# Patient Record
Sex: Female | Born: 1985 | Race: White | Hispanic: Yes | Smoking: Never smoker
Health system: Southern US, Community
[De-identification: ages and names within clinical notes are randomized; demographics above are authoritative.]

## PROBLEM LIST (undated history)

## (undated) ENCOUNTER — Inpatient Hospital Stay (HOSPITAL_COMMUNITY): Payer: Self-pay

## (undated) DIAGNOSIS — Z789 Other specified health status: Secondary | ICD-10-CM

## (undated) HISTORY — DX: Other specified health status: Z78.9

---
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
## 2005-01-22 ENCOUNTER — Ambulatory Visit: Payer: Self-pay | Admitting: Family Medicine

## 2005-02-12 ENCOUNTER — Ambulatory Visit (HOSPITAL_COMMUNITY): Admission: RE | Admit: 2005-02-12 | Discharge: 2005-02-12 | Payer: Self-pay | Admitting: Family Medicine

## 2005-02-22 ENCOUNTER — Ambulatory Visit: Payer: Self-pay | Admitting: Family Medicine

## 2005-03-01 ENCOUNTER — Ambulatory Visit: Payer: Self-pay | Admitting: Sports Medicine

## 2005-03-07 ENCOUNTER — Ambulatory Visit: Payer: Self-pay | Admitting: Family Medicine

## 2005-03-26 ENCOUNTER — Ambulatory Visit: Payer: Self-pay | Admitting: Family Medicine

## 2005-03-27 ENCOUNTER — Ambulatory Visit: Payer: Self-pay | Admitting: Family Medicine

## 2005-03-27 ENCOUNTER — Ambulatory Visit (HOSPITAL_COMMUNITY): Admission: RE | Admit: 2005-03-27 | Discharge: 2005-03-27 | Payer: Self-pay | Admitting: Family Medicine

## 2005-04-25 ENCOUNTER — Ambulatory Visit: Payer: Self-pay | Admitting: Family Medicine

## 2005-05-29 ENCOUNTER — Ambulatory Visit: Payer: Self-pay | Admitting: Sports Medicine

## 2005-06-14 ENCOUNTER — Ambulatory Visit: Payer: Self-pay | Admitting: Family Medicine

## 2005-06-17 ENCOUNTER — Inpatient Hospital Stay (HOSPITAL_COMMUNITY): Admission: RE | Admit: 2005-06-17 | Discharge: 2005-06-17 | Payer: Self-pay | Admitting: Gynecology

## 2005-06-21 ENCOUNTER — Inpatient Hospital Stay (HOSPITAL_COMMUNITY): Admission: AD | Admit: 2005-06-21 | Discharge: 2005-06-21 | Payer: Self-pay | Admitting: Obstetrics & Gynecology

## 2005-06-25 ENCOUNTER — Ambulatory Visit: Payer: Self-pay | Admitting: Family Medicine

## 2005-06-25 ENCOUNTER — Inpatient Hospital Stay (HOSPITAL_COMMUNITY): Admission: RE | Admit: 2005-06-25 | Discharge: 2005-06-28 | Payer: Self-pay | Admitting: Family Medicine

## 2005-07-29 ENCOUNTER — Ambulatory Visit: Payer: Self-pay | Admitting: Family Medicine

## 2006-05-19 ENCOUNTER — Encounter (INDEPENDENT_AMBULATORY_CARE_PROVIDER_SITE_OTHER): Payer: Self-pay | Admitting: Family Medicine

## 2006-05-19 ENCOUNTER — Ambulatory Visit: Payer: Self-pay | Admitting: Family Medicine

## 2006-05-19 LAB — CONVERTED CEMR LAB
Basophils Absolute: 0 10*3/uL (ref 0.0–0.1)
Chlamydia, DNA Probe: NEGATIVE
Eosinophils Relative: 3 % (ref 0–5)
GC Probe Amp, Genital: NEGATIVE
HCT: 38.4 % (ref 36.0–46.0)
Hepatitis B Surface Ag: NEGATIVE
Lymphocytes Relative: 17 % (ref 12–46)
Lymphs Abs: 1.1 10*3/uL (ref 0.7–3.3)
Neutro Abs: 4.4 10*3/uL (ref 1.7–7.7)
Platelets: 317 10*3/uL (ref 150–400)
RDW: 16.2 % — ABNORMAL HIGH (ref 11.5–14.0)
Rh Type: POSITIVE
Rubella: >500 intl units/mL — ABNORMAL HIGH
WBC: 6.5 10*3/uL (ref 4.0–10.5)

## 2006-05-22 ENCOUNTER — Encounter (INDEPENDENT_AMBULATORY_CARE_PROVIDER_SITE_OTHER): Payer: Self-pay | Admitting: Family Medicine

## 2006-05-22 ENCOUNTER — Ambulatory Visit (HOSPITAL_COMMUNITY): Admission: RE | Admit: 2006-05-22 | Discharge: 2006-05-22 | Payer: Self-pay | Admitting: Family Medicine

## 2006-06-06 ENCOUNTER — Ambulatory Visit (HOSPITAL_COMMUNITY): Admission: RE | Admit: 2006-06-06 | Discharge: 2006-06-06 | Payer: Self-pay | Admitting: Family Medicine

## 2006-06-30 ENCOUNTER — Ambulatory Visit: Payer: Self-pay | Admitting: Family Medicine

## 2006-07-31 ENCOUNTER — Ambulatory Visit: Payer: Self-pay | Admitting: Family Medicine

## 2006-08-04 ENCOUNTER — Ambulatory Visit (HOSPITAL_COMMUNITY): Admission: RE | Admit: 2006-08-04 | Discharge: 2006-08-04 | Payer: Self-pay | Admitting: Family Medicine

## 2006-08-05 ENCOUNTER — Ambulatory Visit: Payer: Self-pay | Admitting: Family Medicine

## 2006-08-05 ENCOUNTER — Encounter (INDEPENDENT_AMBULATORY_CARE_PROVIDER_SITE_OTHER): Payer: Self-pay | Admitting: Family Medicine

## 2006-08-19 ENCOUNTER — Telehealth (INDEPENDENT_AMBULATORY_CARE_PROVIDER_SITE_OTHER): Payer: Self-pay | Admitting: *Deleted

## 2006-08-22 ENCOUNTER — Ambulatory Visit: Payer: Self-pay | Admitting: Family Medicine

## 2006-09-02 ENCOUNTER — Ambulatory Visit: Payer: Self-pay | Admitting: Family Medicine

## 2006-09-18 ENCOUNTER — Ambulatory Visit: Payer: Self-pay | Admitting: Sports Medicine

## 2006-09-29 ENCOUNTER — Ambulatory Visit: Payer: Self-pay | Admitting: Sports Medicine

## 2006-10-13 ENCOUNTER — Encounter (INDEPENDENT_AMBULATORY_CARE_PROVIDER_SITE_OTHER): Payer: Self-pay | Admitting: Family Medicine

## 2006-10-13 ENCOUNTER — Ambulatory Visit: Payer: Self-pay | Admitting: Family Medicine

## 2006-10-14 ENCOUNTER — Ambulatory Visit (HOSPITAL_COMMUNITY): Admission: RE | Admit: 2006-10-14 | Discharge: 2006-10-14 | Payer: Self-pay | Admitting: Family Medicine

## 2006-10-14 ENCOUNTER — Telehealth (INDEPENDENT_AMBULATORY_CARE_PROVIDER_SITE_OTHER): Payer: Self-pay | Admitting: *Deleted

## 2006-10-15 ENCOUNTER — Encounter (INDEPENDENT_AMBULATORY_CARE_PROVIDER_SITE_OTHER): Payer: Self-pay | Admitting: Family Medicine

## 2006-10-23 ENCOUNTER — Ambulatory Visit: Payer: Self-pay | Admitting: Family Medicine

## 2006-10-27 ENCOUNTER — Ambulatory Visit: Payer: Self-pay | Admitting: Family Medicine

## 2006-11-02 ENCOUNTER — Ambulatory Visit: Payer: Self-pay | Admitting: Physician Assistant

## 2006-11-02 ENCOUNTER — Inpatient Hospital Stay (HOSPITAL_COMMUNITY): Admission: AD | Admit: 2006-11-02 | Discharge: 2006-11-02 | Payer: Self-pay | Admitting: Obstetrics and Gynecology

## 2006-11-04 ENCOUNTER — Ambulatory Visit: Payer: Self-pay | Admitting: Family Medicine

## 2006-11-05 ENCOUNTER — Encounter (INDEPENDENT_AMBULATORY_CARE_PROVIDER_SITE_OTHER): Payer: Self-pay | Admitting: Family Medicine

## 2006-11-05 ENCOUNTER — Telehealth (INDEPENDENT_AMBULATORY_CARE_PROVIDER_SITE_OTHER): Payer: Self-pay | Admitting: *Deleted

## 2006-11-07 ENCOUNTER — Ambulatory Visit: Payer: Self-pay | Admitting: Obstetrics & Gynecology

## 2006-11-07 ENCOUNTER — Inpatient Hospital Stay (HOSPITAL_COMMUNITY): Admission: AD | Admit: 2006-11-07 | Discharge: 2006-11-08 | Payer: Self-pay | Admitting: Obstetrics & Gynecology

## 2006-11-11 ENCOUNTER — Telehealth (INDEPENDENT_AMBULATORY_CARE_PROVIDER_SITE_OTHER): Payer: Self-pay | Admitting: *Deleted

## 2006-11-19 ENCOUNTER — Telehealth (INDEPENDENT_AMBULATORY_CARE_PROVIDER_SITE_OTHER): Payer: Self-pay | Admitting: Family Medicine

## 2006-12-17 ENCOUNTER — Ambulatory Visit: Payer: Self-pay | Admitting: Family Medicine

## 2010-02-21 ENCOUNTER — Encounter: Payer: Self-pay | Admitting: *Deleted

## 2010-10-24 LAB — URINALYSIS, ROUTINE W REFLEX MICROSCOPIC
Glucose, UA: NEGATIVE
Hgb urine dipstick: NEGATIVE
Specific Gravity, Urine: 1.015
pH: 7

## 2010-10-24 LAB — URINE MICROSCOPIC-ADD ON

## 2010-10-24 LAB — CBC
Hemoglobin: 12.7
MCHC: 34.1
Platelets: 230
RDW: 16 — ABNORMAL HIGH

## 2011-12-02 ENCOUNTER — Emergency Department (HOSPITAL_COMMUNITY): Payer: No Typology Code available for payment source

## 2011-12-02 ENCOUNTER — Emergency Department (HOSPITAL_COMMUNITY)
Admission: EM | Admit: 2011-12-02 | Discharge: 2011-12-02 | Disposition: A | Discharge status: Home / Self Care | Payer: No Typology Code available for payment source | Attending: Emergency Medicine | Admitting: Emergency Medicine

## 2011-12-02 ENCOUNTER — Encounter (HOSPITAL_COMMUNITY): Payer: Self-pay | Admitting: *Deleted

## 2011-12-02 DIAGNOSIS — Z331 Pregnant state, incidental: Secondary | ICD-10-CM | POA: Insufficient documentation

## 2011-12-02 DIAGNOSIS — Z3490 Encounter for supervision of normal pregnancy, unspecified, unspecified trimester: Secondary | ICD-10-CM

## 2011-12-02 DIAGNOSIS — R51 Headache: Secondary | ICD-10-CM | POA: Insufficient documentation

## 2011-12-02 DIAGNOSIS — S139XXA Sprain of joints and ligaments of unspecified parts of neck, initial encounter: Secondary | ICD-10-CM | POA: Insufficient documentation

## 2011-12-02 DIAGNOSIS — R110 Nausea: Secondary | ICD-10-CM | POA: Insufficient documentation

## 2011-12-02 DIAGNOSIS — H539 Unspecified visual disturbance: Secondary | ICD-10-CM | POA: Insufficient documentation

## 2011-12-02 DIAGNOSIS — Y929 Unspecified place or not applicable: Secondary | ICD-10-CM | POA: Insufficient documentation

## 2011-12-02 DIAGNOSIS — R42 Dizziness and giddiness: Secondary | ICD-10-CM | POA: Insufficient documentation

## 2011-12-02 DIAGNOSIS — S161XXA Strain of muscle, fascia and tendon at neck level, initial encounter: Secondary | ICD-10-CM

## 2011-12-02 DIAGNOSIS — R55 Syncope and collapse: Secondary | ICD-10-CM

## 2011-12-02 DIAGNOSIS — Y939 Activity, unspecified: Secondary | ICD-10-CM | POA: Insufficient documentation

## 2011-12-02 LAB — COMPREHENSIVE METABOLIC PANEL
ALT: 14 U/L (ref 0–35)
AST: 20 U/L (ref 0–37)
CO2: 24 mEq/L (ref 19–32)
Calcium: 9.5 mg/dL (ref 8.4–10.5)
Chloride: 102 mEq/L (ref 96–112)
Creatinine, Ser: 0.61 mg/dL (ref 0.50–1.10)
GFR calc Af Amer: >90 mL/min (ref >90)
GFR calc non Af Amer: >90 mL/min (ref >90)
Glucose, Bld: 92 mg/dL (ref 70–99)
Sodium: 138 mEq/L (ref 135–145)
Total Bilirubin: 0.4 mg/dL (ref 0.3–1.2)

## 2011-12-02 LAB — URINALYSIS, ROUTINE W REFLEX MICROSCOPIC
Bilirubin Urine: NEGATIVE
Nitrite: NEGATIVE
Protein, ur: NEGATIVE mg/dL
Specific Gravity, Urine: 1.016 (ref 1.005–1.030)
Urobilinogen, UA: 0.2 mg/dL (ref 0.0–1.0)

## 2011-12-02 LAB — URINE MICROSCOPIC-ADD ON

## 2011-12-02 LAB — CBC WITH DIFFERENTIAL/PLATELET
Eosinophils Relative: 0 % (ref 0–5)
HCT: 38.7 % (ref 36.0–46.0)
Lymphocytes Relative: 17 % (ref 12–46)
Lymphs Abs: 2.2 10*3/uL (ref 0.7–4.0)
MCV: 88.4 fL (ref 78.0–100.0)
Monocytes Absolute: 0.9 10*3/uL (ref 0.1–1.0)
Neutro Abs: 9.3 10*3/uL — ABNORMAL HIGH (ref 1.7–7.7)
RBC: 4.38 MIL/uL (ref 3.87–5.11)
WBC: 12.4 10*3/uL — ABNORMAL HIGH (ref 4.0–10.5)

## 2011-12-02 LAB — HCG, QUANTITATIVE, PREGNANCY: hCG, Beta Chain, Quant, S: 130 m[IU]/mL — ABNORMAL HIGH (ref <5)

## 2011-12-02 MED ORDER — ACETAMINOPHEN 500 MG PO TABS: 500.0000 mg | ORAL_TABLET | Freq: Four times a day (QID) | ORAL | Status: DC | PRN

## 2011-12-02 MED ORDER — ONDANSETRON HCL 4 MG PO TABS: 4.0000 mg | ORAL_TABLET | Freq: Four times a day (QID) | ORAL | Status: DC

## 2011-12-02 MED ORDER — ACETAMINOPHEN 325 MG PO TABS
650.0000 mg | ORAL_TABLET | Freq: Once | ORAL | Status: AC
  Administered 2011-12-02: 650 mg via ORAL
  Filled 2011-12-02: qty 2
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
## 2011-12-02 NOTE — ED Notes (Signed)
Urine specimen obtained.

## 2011-12-04 LAB — URINE CULTURE

## 2011-12-06 ENCOUNTER — Inpatient Hospital Stay (HOSPITAL_COMMUNITY)
Admission: AD | Admit: 2011-12-06 | Discharge: 2011-12-06 | Disposition: A | Discharge status: Home / Self Care | Payer: Self-pay | Source: Ambulatory Visit | Attending: Obstetrics & Gynecology | Admitting: Obstetrics & Gynecology

## 2011-12-06 ENCOUNTER — Encounter (HOSPITAL_COMMUNITY): Payer: Self-pay

## 2011-12-06 DIAGNOSIS — O99891 Other specified diseases and conditions complicating pregnancy: Secondary | ICD-10-CM | POA: Insufficient documentation

## 2011-12-06 DIAGNOSIS — Z3400 Encounter for supervision of normal first pregnancy, unspecified trimester: Secondary | ICD-10-CM

## 2011-12-06 MED ORDER — CONCEPT OB 130-92.4-1 MG PO CAPS: 1.0000 | ORAL_CAPSULE | ORAL | Status: DC
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
## 2011-12-06 NOTE — MAU Provider Note (Signed)
Attestation of Attending Supervision of Advanced Practitioner (CNM/NP): Evaluation and management procedures were performed by the Advanced Practitioner under my supervision and collaboration.  I have reviewed the Advanced Practitioner's note and chart, and I agree with the management and plan.  Deasia Chiu, MD, FACOG Attending Obstetrician & Gynecologist Faculty Practice, [facility]  

## 2012-01-04 ENCOUNTER — Encounter (HOSPITAL_COMMUNITY): Payer: Self-pay | Admitting: Emergency Medicine

## 2012-01-04 ENCOUNTER — Emergency Department (INDEPENDENT_AMBULATORY_CARE_PROVIDER_SITE_OTHER)
Admission: EM | Admit: 2012-01-04 | Discharge: 2012-01-04 | Disposition: A | Discharge status: Home / Self Care | Payer: Self-pay | Source: Home / Self Care

## 2012-01-04 DIAGNOSIS — R1110 Vomiting, unspecified: Secondary | ICD-10-CM

## 2012-01-04 DIAGNOSIS — K219 Gastro-esophageal reflux disease without esophagitis: Secondary | ICD-10-CM

## 2012-01-04 LAB — POCT URINALYSIS DIP (DEVICE)
Ketones, ur: >=160 mg/dL — AB
Protein, ur: NEGATIVE mg/dL
Specific Gravity, Urine: 1.02 (ref 1.005–1.030)
Urobilinogen, UA: 1 mg/dL (ref 0.0–1.0)
pH: 6 (ref 5.0–8.0)

## 2012-01-04 MED ORDER — FAMOTIDINE 20 MG PO TABS: 20.0000 mg | ORAL_TABLET | Freq: Two times a day (BID) | ORAL | Status: DC

## 2012-01-04 MED ORDER — PROMETHAZINE HCL 12.5 MG PO TABS: 12.5000 mg | ORAL_TABLET | Freq: Four times a day (QID) | ORAL | Status: DC | PRN

## 2012-01-04 NOTE — ED Provider Notes (Signed)
Jacqueline Graves is a 26 y.o. female who presents to urgent care today for nausea vomiting and acid reflux.  Present for about one month. Patient is about 2 months into her first pregnancy.  She has not been able to eat much and does not feel very well.  She was initially prescribed Zofran and her [facility] but wasn't able to fill it as it was too expensive.  She denies any fevers chills nausea vomiting or abdominal pain.  She has acid reflux.  Most food makes her nauseated thinking about it.  She is waiting for adopt a mom placement.  No vaginal bleeding discharge fevers chills or headaches.    PMH reviewed. Pregnancy History  Substance Use Topics  . Smoking status: Never Smoker   . Smokeless tobacco: Not on file  . Alcohol Use: No   ROS as above otherwise neg   Exam:  BP 111/74  Pulse 100  Temp 99.2 F (37.3 C) (Oral)  Resp 18  SpO2 100%  LMP 11/03/2011 Gen: Well NAD HEENT: EOMI,  MMM Lungs: CTABL Nl WOB Heart: RRR no MRG Abd: NABS, NT, ND.  Fundus above the pelvis palpable Exts: Non edematous BL  LE, warm and well perfused.    Assessment and plan:  26 year old G1 P0 at about 2 months with nausea vomiting and likely acid reflux.  Plan to treat with famotidine twice daily for reflux. Additionally we'll treat with Phenergan as needed for nausea and vomiting and she cannot afford Zofran.  Patient will followup back here or at the MAU if not improved in unable to get in with a primary care Dr.     Rodolph Bong, MD 01/04/12 [phone]

## 2012-01-04 NOTE — ED Notes (Signed)
Pt is here for nauseas and vomiting x2 months... She's 2 months preg.... Denies: fevers, diarrhea... She is alert w/no signs of acute distress.

## 2012-01-05 NOTE — ED Provider Notes (Signed)
Medical screening examination/treatment/procedure(s) were performed by a resident physician and as supervising physician I was immediately available for consultation/collaboration.  Leslee Home, M.D.   Reuben Likes, MD 01/05/12 1045

## 2012-01-11 ENCOUNTER — Other Ambulatory Visit: Payer: Self-pay | Admitting: Family Medicine

## 2012-01-15 NOTE — L&D Delivery Note (Signed)
Delivery Summary for Jacqueline Graves  Labor Events:   Preterm labor:   Rupture date:   Rupture time:   Rupture type: Artificial  Fluid Color: Clear  Induction:   Augmentation:   Complications:   Cervical ripening:          Delivery:   Episiotomy:   Lacerations:   Repair suture:   Repair # of packets:   Blood loss (ml): 350   Information for the patient's newborn:  Kadin, Bera [[id number]]    Delivery 08/06/2012 3:35 PM by  Vaginal, Spontaneous Delivery Sex:  female Gestational Age: [redacted]w[redacted]d Delivery Clinician:  Jolyn Lent Living?: Yes        APGARS  One minute Five minutes Ten minutes  Skin color: 1   1      Heart rate: 2   2      Grimace: 2   2      Muscle tone: 2   2      Breathing: 2   2      Totals: 9  9      Presentation/position: Vertex  Right Occiput Anterior Resuscitation: None  Cord information: 3 vessels   Disposition of cord blood: No    Blood gases sent? No Complications: None  Placenta: Delivered: 08/06/2012 3:40 PM  Spontaneous  Intact appearance Newborn Measurements: Weight:   Height:   Head circumference:   Chest circumference:   Other providers: Delivery Nurse Midwife Marlaine Hind Melissa Noon  Additional  information: Forceps:   Vacuum:   Breech:   Observed anomalies        svd of female infant over hemostatice first degree tear. Manual extraction of placenta. Ancef 1g following. Hemostatic. Sweep negative for membranes. Continue to monitor.

## 2012-02-04 ENCOUNTER — Inpatient Hospital Stay (HOSPITAL_COMMUNITY): Payer: Self-pay

## 2012-02-04 ENCOUNTER — Encounter (HOSPITAL_COMMUNITY): Payer: Self-pay

## 2012-02-04 ENCOUNTER — Inpatient Hospital Stay (HOSPITAL_COMMUNITY)
Admission: AD | Admit: 2012-02-04 | Discharge: 2012-02-04 | Disposition: A | Discharge status: Home / Self Care | Payer: Self-pay | Source: Ambulatory Visit | Attending: Obstetrics and Gynecology | Admitting: Obstetrics and Gynecology

## 2012-02-04 DIAGNOSIS — O210 Mild hyperemesis gravidarum: Secondary | ICD-10-CM | POA: Insufficient documentation

## 2012-02-04 DIAGNOSIS — R109 Unspecified abdominal pain: Secondary | ICD-10-CM | POA: Insufficient documentation

## 2012-02-04 DIAGNOSIS — M545 Low back pain, unspecified: Secondary | ICD-10-CM | POA: Insufficient documentation

## 2012-02-04 LAB — COMPREHENSIVE METABOLIC PANEL
AST: 18 U/L (ref 0–37)
BUN: 4 mg/dL — ABNORMAL LOW (ref 6–23)
CO2: 22 mEq/L (ref 19–32)
Calcium: 8.9 mg/dL (ref 8.4–10.5)
Chloride: 100 mEq/L (ref 96–112)
Creatinine, Ser: 0.52 mg/dL (ref 0.50–1.10)
GFR calc non Af Amer: >90 mL/min (ref >90)
Total Bilirubin: 0.4 mg/dL (ref 0.3–1.2)

## 2012-02-04 LAB — URINALYSIS, ROUTINE W REFLEX MICROSCOPIC
Glucose, UA: NEGATIVE mg/dL
Ketones, ur: 40 mg/dL — AB
Leukocytes, UA: NEGATIVE
Protein, ur: NEGATIVE mg/dL
pH: 6 (ref 5.0–8.0)

## 2012-02-04 LAB — CBC
HCT: 33.2 % — ABNORMAL LOW (ref 36.0–46.0)
Hemoglobin: 11.2 g/dL — ABNORMAL LOW (ref 12.0–15.0)
MCHC: 33.7 g/dL (ref 30.0–36.0)
MCV: 89.2 fL (ref 78.0–100.0)
WBC: 8.6 10*3/uL (ref 4.0–10.5)

## 2012-02-04 LAB — URINE MICROSCOPIC-ADD ON

## 2012-02-04 LAB — WET PREP, GENITAL
Clue Cells Wet Prep HPF POC: NONE SEEN
Trich, Wet Prep: NONE SEEN

## 2012-02-04 MED ORDER — DOCUSATE SODIUM 100 MG PO CAPS: 100.0000 mg | ORAL_CAPSULE | Freq: Two times a day (BID) | ORAL | Status: DC

## 2012-02-04 MED ORDER — DEXTROSE 5 % IN LACTATED RINGERS IV BOLUS
1000.0000 mL | Freq: Once | INTRAVENOUS | Status: AC
  Administered 2012-02-04: 1000 mL via INTRAVENOUS

## 2012-02-04 MED ORDER — ONDANSETRON 8 MG PO TBDP: 8.0000 mg | ORAL_TABLET | Freq: Three times a day (TID) | ORAL | Status: DC | PRN

## 2012-02-04 MED ORDER — ONDANSETRON 8 MG PO TBDP
8.0000 mg | ORAL_TABLET | Freq: Once | ORAL | Status: AC
  Administered 2012-02-04: 8 mg via ORAL
  Filled 2012-02-04: qty 1

## 2012-02-04 MED ORDER — PROMETHAZINE HCL 25 MG PO TABS: ORAL_TABLET | ORAL | Status: DC

## 2012-02-04 NOTE — MAU Note (Signed)
Patient states she started having low back pain and lower abdominal pain with blood tinged "webbed" vaginal discharge for 2 days. Has had nausea and vomiting every day.

## 2012-02-04 NOTE — MAU Provider Note (Signed)
History     CSN: [id number]  Arrival date and time: 02/04/12 1613   None     Chief Complaint  Patient presents with  . Abdominal Pain  . Back Pain  . Vaginal Discharge  . Emesis   HPI Pt is [redacted]w[redacted]d pregnant and presents with lower back pain and lower abdominal pain for 2 days.  Pt has had nausea and vomiting and has not taken anything for the nausea or the pain.  Pt last ate this morning at 10 am and had 1/2 slice of bread. Pt is waiting for Adopt a Mom.  Pt also noted blood tinged vaginal discharge.  Pt has had some constipation but had a normal bowel movement this morning with minimal improvement in pain. Pt denies UTI symptoms. Pt had 2 previous normal full term vaginal deliveries without any complications. No past medical history on file.  No past surgical history on file.  No family history on file.  History  Substance Use Topics  . Smoking status: Never Smoker   . Smokeless tobacco: Not on file  . Alcohol Use: No    Allergies: No Known Allergies  Prescriptions prior to admission  Medication Sig Dispense Refill  . acetaminophen (TYLENOL) 500 MG tablet Take 1 tablet (500 mg total) by mouth every 6 (six) hours as needed for pain.  30 tablet  0  . famotidine (PEPCID) 20 MG tablet Take 1 tablet (20 mg total) by mouth 2 (two) times daily.  60 tablet  3  . Prenat w/o A Vit-FeFum-FePo-FA (CONCEPT OB) 130-92.4-1 MG CAPS Take 1 tablet by mouth 1 day or 1 dose.  30 capsule  5  . promethazine (PHENERGAN) 12.5 MG tablet Take 1 tablet (12.5 mg total) by mouth every 6 (six) hours as needed for nausea.  30 tablet  0    Review of Systems  Constitutional: Negative for fever and chills.  Gastrointestinal: Positive for nausea, vomiting, abdominal pain and constipation. Negative for diarrhea.  Musculoskeletal: Positive for back pain.   Physical Exam   Blood pressure 108/65, pulse 79, temperature 98.6 F (37 C), temperature source Oral, resp. rate 16, height 5\' 3"  (1.6 m), weight 134  lb 12.8 oz (61.145 kg), last menstrual period 11/03/2011, SpO2 99.00%.  Physical Exam  Nursing note and vitals reviewed. Constitutional: She is oriented to person, place, and time. She appears well-developed and well-nourished. No distress.  HENT:  Head: Normocephalic.  Eyes: Pupils are equal, round, and reactive to light.  Neck: Normal range of motion. Neck supple.  Cardiovascular: Normal rate.   Respiratory: Effort normal.  GI: Soft. Bowel sounds are normal. She exhibits no distension. There is tenderness. There is guarding. There is no rebound.       +FHT with doppler; left lower abdominal tenderness- no rebound; no CVA tenderness  Genitourinary: No vaginal discharge found.       Small amount of white creamy vaginal discharge; cervix long, closed; nontender; uterus gravid 13 week size; adnexal without palpable enlargement or tenderness; no bleeding noted  Musculoskeletal: Normal range of motion.  Neurological: She is alert and oriented to person, place, and time.  Skin: Skin is warm and dry.  Psychiatric: She has a normal mood and affect.  interpreter utilized  MAU Course  Procedures Clinical Data: 27 year old pregnant female with pelvic pain and  bleeding. Estimated gestational age of [redacted] weeks 2 days by LMP.  OBSTETRIC <14 WK ULTRASOUND  Technique: Transabdominal ultrasound was performed for evaluation  of the gestation as well  as the maternal uterus and adnexal  regions.  Comparison: None  Intrauterine gestational sac: Visualized/normal in shape.  Yolk sac: Not visualized  Embryo: Visualized  Cardiac Activity: Visualized  Heart Rate: 153 bpm  CRL: 7.35 cm 13 w 4 d [place] EDC: 08/07/2012  Maternal uterus/Adnexae:  There is no evidence of subchorionic hemorrhage.  The right ovary is not visualized.  The left ovary is unremarkable.  There is no evidence of free fluid or adnexal mass.  IMPRESSION:  Single living intrauterine gestation with estimated gestational age  of [redacted]  weeks 4 days by this ultrasound.  No evidence of subchorionic hemorrhage.  Right ovary not visualized.  This exam is performed on an emergent basis and does not  comprehensively evaluate fetal size, dating, or anatomy, and a  follow-up complete OB US should be considered if further fetal  assessment is warranted. .  Original Report Authenticated By: Harmon Pier, M.D.             External Result Report   Pt tolerated juice and crackers and was hungry after Zofran 8mg  ODT and 1 liter D5LR Ready to go home Will try to arrange for pt to be seen in [facility] OB clinic, since pt desires to pursue OB care and has not been able to get doctor with Adopt [facility] Mom at this point Results for orders placed during the hospital encounter of 02/04/12 (from the past 24 hour(s))  URINALYSIS, ROUTINE W REFLEX MICROSCOPIC     Status: Abnormal   Collection Time   02/04/12  4:45 PM      Component Value Range   Color, Urine YELLOW  YELLOW   APPearance CLEAR  CLEAR   Specific Gravity, Urine 1.025  1.005 - 1.030   pH 6.0  5.0 - 8.0   Glucose, UA NEGATIVE  NEGATIVE mg/dL   Hgb urine dipstick TRACE (*) NEGATIVE   Bilirubin Urine NEGATIVE  NEGATIVE   Ketones, ur 40 (*) NEGATIVE mg/dL   Protein, ur NEGATIVE  NEGATIVE mg/dL   Urobilinogen, UA 0.2  0.0 - 1.0 mg/dL   Nitrite NEGATIVE  NEGATIVE   Leukocytes, UA NEGATIVE  NEGATIVE  URINE MICROSCOPIC-ADD ON     Status: Abnormal   Collection Time   02/04/12  4:45 PM      Component Value Range   Squamous Epithelial / LPF RARE  RARE   WBC, UA 3-6  <3 WBC/hpf   RBC / HPF 3-6  <3 RBC/hpf   Bacteria, UA MANY (*) RARE   Urine-Other MUCOUS PRESENT    CBC     Status: Abnormal   Collection Time   02/04/12  5:38 PM      Component Value Range   WBC 8.6  4.0 - 10.5 K/uL   RBC 3.72 (*) 3.87 - 5.11 MIL/uL   Hemoglobin 11.2 (*) 12.0 - 15.0 g/dL   HCT 16.1 (*) 09.6 - 04.5 %   MCV 89.2  78.0 - 100.0 fL   MCH 30.1  26.0 - 34.0 pg   MCHC 33.7  30.0 - 36.0 g/dL   RDW 40.9  81.1 -  91.4 %   Platelets 284  150 - 400 K/uL  COMPREHENSIVE METABOLIC PANEL     Status: Abnormal   Collection Time   02/04/12  5:38 PM      Component Value Range   Sodium 136  135 - 145 mEq/L   Potassium 3.4 (*) 3.5 - 5.1 mEq/L   Chloride 100  96 - 112 mEq/L  CO2 22  19 - 32 mEq/L   Glucose, Bld 81  70 - 99 mg/dL   BUN 4 (*) 6 - 23 mg/dL   Creatinine, Ser 1.61  0.50 - 1.10 mg/dL   Calcium 8.9  8.4 - 09.6 mg/dL   Total Protein 6.7  6.0 - 8.3 g/dL   Albumin 3.4 (*) 3.5 - 5.2 g/dL   AST 18  0 - 37 U/L   ALT 16  0 - 35 U/L   Alkaline Phosphatase 46  39 - 117 U/L   Total Bilirubin 0.4  0.3 - 1.2 mg/dL   GFR calc non Af Amer >90  >90 mL/min   GFR calc Af Amer >90  >90 mL/min  WET PREP, GENITAL     Status: Abnormal   Collection Time   02/04/12  5:40 PM      Component Value Range   Yeast Wet Prep HPF POC NONE SEEN  NONE SEEN   Trich, Wet Prep NONE SEEN  NONE SEEN   Clue Cells Wet Prep HPF POC NONE SEEN  NONE SEEN   WBC, Wet Prep HPF POC MANY (*) NONE SEEN    Assessment and Plan  Hyperemesis in Pregnancy  Ferman Basilio 02/04/2012, 4:44 PM

## 2012-02-05 LAB — GC/CHLAMYDIA PROBE AMP
CT Probe RNA: NEGATIVE
GC Probe RNA: NEGATIVE

## 2012-02-05 LAB — URINE CULTURE

## 2012-02-05 NOTE — MAU Provider Note (Signed)
Attestation of Attending Supervision of Advanced Practitioner (CNM/NP): Evaluation and management procedures were performed by the Advanced Practitioner under my supervision and collaboration.  I have reviewed the Advanced Practitioner's note and chart, and I agree with the management and plan.  Jareth Pardee 02/05/2012 8:05 AM

## 2012-03-02 ENCOUNTER — Other Ambulatory Visit: Payer: Self-pay | Admitting: Obstetrics and Gynecology

## 2012-03-02 ENCOUNTER — Encounter: Payer: Self-pay | Admitting: Advanced Practice Midwife

## 2012-03-02 ENCOUNTER — Ambulatory Visit (INDEPENDENT_AMBULATORY_CARE_PROVIDER_SITE_OTHER): Payer: Self-pay | Admitting: Advanced Practice Midwife

## 2012-03-02 VITALS — BP 115/69 | Ht 64.75 in | Wt 140.6 lb

## 2012-03-02 DIAGNOSIS — Z3480 Encounter for supervision of other normal pregnancy, unspecified trimester: Secondary | ICD-10-CM

## 2012-03-02 MED ORDER — RANITIDINE HCL 150 MG PO TABS: 150.0000 mg | ORAL_TABLET | Freq: Two times a day (BID) | ORAL | Status: DC

## 2012-03-02 MED ORDER — METOCLOPRAMIDE HCL 10 MG PO TABS: 10.0000 mg | ORAL_TABLET | Freq: Four times a day (QID) | ORAL | Status: DC

## 2012-03-02 NOTE — Patient Instructions (Addendum)
Embarazo - Segundo trimestre (Pregnancy - Second Trimester) El segundo trimestre del Psychiatrist (del 3 al ) es un perodo de evolucin rpida para usted y el beb. Hacia el final del sexto mes, el beb mide aproximadamente 23 cm y pesa 680 g. Comenzar a Pharmacologist del beb [place] 18 y las 20 [address] de [place]. Podr sentir las pataditas ("quickening en ingls"). Hay un rpido [employer]. Puede segregar un lquido claro Charity fundraiser) de las Gregory. Quizs sienta pequeas contracciones en el vientre (tero) Esto se conoce como falso trabajo de parto o contracciones de Braxton-Hicks. Es como una prctica del trabajo de parto que se produce cuando el beb est listo para salir. Generalmente los problemas de vmitos matinales ya se han superado hacia el final del Medical sales representative. Algunas mujeres desarrollan pequeas manchas oscuras (que se denominan cloasma, mscara del embarazo) en la cara que normalmente se van luego del nacimiento del beb. La exposicin al sol empeora las manchas. Puede desarrollarse acn en algunas mujeres embarazadas, y puede desaparecer en aquellas que ya tienen acn. EXAMENES PRENATALES  Durante los Manpower Inc, deber seguir realizando pruebas de [place], segn avance el [place]. Estas pruebas se realizan para controlar su salud y la del beb. Tambin se realizan anlisis de sangre para [employer] niveles de [place]. La anemia (bajo nivel de hemoglobina) es frecuente durante el embarazo. Para prevenirla, se administran hierro y vitaminas. Tambin se le realizarn exmenes para saber si tiene diabetes entre las 24 y las 28 semanas del [place]. Podrn repetirle algunas de las [employer]ov[employer] Enterprises hicieron previamente.  En cada visita le medirn el tamao del tero. Esto se realiza para asegurarse de que el beb est creciendo correctamente de acuerdo al estado del [place].  Tambin en cada visita prenatal controlarn su presin arterial. Esto se realiza  para asegurarse de que no tenga toxemia.  Se controlar su orina para asegurarse de que no tenga infecciones, diabetes o protena en la orina.  Se controlar su peso regularmente para asegurarse que el aumento ocurre al ritmo indicado. Esto se hace para asegurarse que usted y el beb tienen una evolucin normal.  En algunas ocasiones se realiza una prueba de ultrasonido para confirmar el correcto desarrollo y evolucin del beb. Esta prueba se realiza con ondas sonoras inofensivas para el beb, de modo que el profesional pueda calcular ms precisamente la fecha del Darwin. Algunas veces se realizan pruebas especializadas del lquido amnitico que rodea al beb. Esta prueba se denomina amniocentesis. El lquido amnitico se obtiene introduciendo una aguja en el vientre (abdomen). Se realiza para Conservator, museum/gallery en los que existe alguna preocupacin acerca de algn problema gentico que pueda sufrir el beb. En ocasiones se lleva a cabo cerca del final del embarazo, si es necesario inducir al Apple Computer. En este caso se realiza para asegurarse que los pulmones del beb estn lo suficientemente maduros como para que pueda vivir fuera del tero. CAMBIOS QUE OCURREN EN EL SEGUNDO TRIMESTRE DEL EMBARAZO Su organismo atravesar numerosos cambios durante el [employer] Lots. Estos pueden variar de Neomia Dear persona a otra. [place] con el profesional que la asiste acerca los cambios que usted note y que la preocupen.  Durante el segundo trimestre probablemente sienta un aumento del apetito. Es normal tener "antojos" de Development worker, community. Esto vara de Neomia Dear persona a otra y de un embarazo a Therapist, art.  El abdomen inferior comenzar a abultarse.  Podr tener la necesidad de Geographical information systems officer con ms frecuencia debido a que  el tero y el beb presionan sobre la vejiga. Tambin es frecuente contraer ms infecciones urinarias durante el embarazo (dolor al ConocoPhillips). Puede evitarlas bebiendo gran cantidad de lquidos y vaciando  la vejiga antes y despus de Sales promotion account executive.  Podrn aparecer las primeras estras en las caderas, abdomen y [place]. Estos son cambios normales del cuerpo durante el [place]. No existen medicamentos ni ejercicios que puedan prevenir [employer].  Es posible que comience a desarrollar venas inflamadas y abultadas (varices) en las piernas. El uso de medias de descanso, Optometrist sus pies durante 15 minutos, 3 a 4 veces al da y Film/video editor la sal en su dieta ayuda a Journalist, newspaper.  Podr sentir Engineering geologist gstrica a medida que el tero crece y Doctor, general practice. Puede tomar anticidos, con la autorizacin de su mdico, para Financial planner. Tambin es til ingerir pequeas comidas 4 a 5 veces al Futures trader.  La constipacin puede tratarse con un laxante o agregando fibra a su dieta. Beber grandes cantidades de lquidos, comer vegetales, frutas y granos integrales es de [place].  Tambin es beneficioso practicar actividad fsica. Si ha sido una persona Engineer, mining, podr continuar con la Harley[employer]son de las actividades durante el mismo. Si ha sido [employer], puede ser beneficioso que comience con un programa de ejercicios, Museum/gallery exhibitions officer.  Puede desarrollar hemorroides (vrices en el recto) hacia el final del segundo trimestre. Tomar baos de asiento tibios y Chemical engineer cremas recomendadas por el profesional que lo asiste sern de ayuda para los problemas de hemorroides.  Tambin podr Financial risk analyst de espalda durante este momento de su embarazo. Evite levantar objetos pesados, utilice zapatos de taco bajo y [place] buena postura para ayudar a reducir los problemas de [place].  Algunas mujeres embarazadas desarrollan hormigueo y adormecimiento de la mano y los dedos debido a la hinchazn y compresin de los ligamentos de la mueca (sndrome del tnel carpiano). Esto desaparece una vez que el beb nace.  Como sus pechos se agrandan, Pension scheme manager un sujetador  ms grande. Use un sostn de soporte, cmodo y de algodn. No utilice un sostn para amamantar hasta el ltimo mes de embarazo si va a amamantar al beb.  Podr observar una lnea oscura desde el ombligo hacia la zona pbica denominada linea nigra.  Podr observar que sus mejillas se ponen coloradas debido al aumento de flujo sanguneo en la cara.  Podr desarrollar "araitas" en la cara, cuello y pecho. Esto desaparece una vez que el beb nace. INSTRUCCIONES PARA EL CUIDADO DOMICILIARIO  Es extremadamente importante que evite el cigarrillo, hierbas medicinales, alcohol y las drogas no prescriptas durante el Psychiatrist. Estas sustancias qumicas afectan la formacin y el desarrollo del beb. Evite estas sustancias durante todo el embarazo para asegurar el nacimiento de un beb sano.  La mayor parte de los cuidados que se aconsejan son los mismos que los indicados para Financial risk analyst trimestre del Psychiatrist. Cumpla con las citas tal como se le indic. Siga las instrucciones del profesional que lo asiste con respecto al uso de los medicamentos, el ejercicio y Psychologist, forensic.  Durante el embarazo debe obtener nutrientes para usted y para su beb. Consuma alimentos balanceados a intervalos regulares. Elija alimentos como carne, pescado, [place] y otros productos lcteos descremados, vegetales, frutas, panes integrales y cereales. El Equities trader cul es el aumento de peso ideal.  Las relaciones sexuales fsicas pueden continuarse hasta cerca del fin del embarazo si no existen otros problemas. Estos  problemas pueden ser la prdida temprana (prematura) de lquido amnitico de las [place], sangrado vaginal, dolor abdominal u otros problemas mdicos o del Psychiatrist.  Realice Tesoro Corporation, si no tiene restricciones. Consulte con el profesional que la asiste si no sabe con certeza si determinados ejercicios son seguros. El mayor aumento de peso tiene Environmental consultant durante los ltimos 2 trimestres del  Psychiatrist. El ejercicio la ayudar a:  Engineering geologist.  Ponerla en forma para el parto.  Ayudarla a perder peso luego de haber dado a luz.  Use un buen sostn o como los que se usan para hacer deportes para Paramedic la sensibilidad de las [place]. Tambin puede serle til si lo [place] mientras duerme. Si pierde Product manager, podr Parker Hann[employer]in.  No utilice la baera con agua caliente, baos turcos y saunas durante el [address]  Utilice el cinturn de seguridad sin excepcin cuando conduzca. Este la proteger a usted y al beb en caso de accidente.  Evite comer carne cruda, queso crudo, y el contacto con los utensilios y desperdicios de los gatos. Estos elementos contienen grmenes que pueden causar defectos de nacimiento en el beb.  El segundo trimestre es un buen momento para visitar a su dentista y Software engineer si an no lo ha hecho. Es Primary school teacher los dientes limpios. Utilice un cepillo de dientes blando. Cepllese ms suavemente durante el embarazo.  Es ms fcil perder algo de orina durante el [place]. Apretar y Chief Operating Officer los msculos de la pelvis la ayudar con este problema. Practique detener la miccin cuando est en el bao. Estos son los mismos msculos que Development worker, international aid. Son [employer] mismos msculos que utiliza cuando trata de [employer] gases. Puede practicar apretando estos msculos 10 veces, y repetir esto 3 veces por da aproximadamente. Una vez que conozca qu msculos debe apretar, no realice estos ejercicios durante la miccin. Puede favorecerle una infeccin si la orina vuelve hacia atrs.  Pida ayuda si tiene necesidades econmicas, de asesoramiento o nutricionales durante el [place]. El profesional podr ayudarla con respecto a estas necesidades, o derivarla a otros especialistas.  La piel puede ponerse grasa. Si esto sucede, lvese la cara con un jabn [place], utilice un humectante no graso y [place] con base de aceite o  crema. CONSUMO DE MEDICAMENTOS Y DROGAS DURANTE EL EMBARAZO  Contine tomando las vitaminas apropiadas para esta etapa tal como se le indic. Las vitaminas deben contener un miligramo de cido flico y deben suplementarse con hierro. Guarde todas las vitaminas fuera del alcance de los nios. La ingestin de slo un par de vitaminas o tabletas que contengan hierro puede ocasionar la [employer] en un beb o en un nio pequeo.  Evite el uso de [place], inclusive los de venta libre y hierbas que no hayan sido prescriptos o indicados por el profesional que la asiste. Algunos medicamentos pueden causar problemas fsicos al beb. Utilice los medicamentos de venta libre o de prescripcin para Chief Technology Officer, Environmental health practitioner o la [place], segn se lo indique el profesional que lo asiste. No utilice aspirina.  El consumo de alcohol est relacionado con ciertos defectos de nacimiento. Esto incluye el sndrome de alcoholismo fetal. Debe evitar el consumo de alcohol en cualquiera de sus formas. El cigarrillo causa nacimientos prematuros y bebs de [place]. El uso de drogas recreativas est absolutamente prohibido. Son muy nocivas para el beb. Un beb que nace de [employer] Express, ser adicto al nacer. Ese beb tendr los mismos  sntomas de abstinencia que un adulto.  Infrmele al profesional si consume alguna droga.  No consuma drogas ilegales. Pueden causarle mucho dao al beb. SOLICITE ATENCIN MDICA SI: Tiene preguntas o preocupaciones durante su embarazo. Es mejor que llame para Science writer las dudas que esperar hasta su prxima visita prenatal. Thressa Sheller forma se sentir ms tranquila.  SOLICITE ATENCIN MDICA DE INMEDIATO SI:  La temperatura oral se eleva sin motivo por encima de 102 F (38.9 C) o segn le indique el profesional que lo asiste.  Tiene una prdida de lquido por la vagina (canal de parto). Si sospecha una ruptura de las [place], tmese la temperatura y llame al profesional para informarlo sobre  esto.  Observa unas pequeas manchas, una hemorragia vaginal o elimina cogulos. Notifique al profesional acerca de la cantidad y de cuntos apsitos est utilizando. Unas pequeas manchas de sangre son algo comn durante el Psychiatrist, especialmente despus de Sales promotion account executive.  Presenta un olor desagradable en la secrecin vaginal y observa un cambio en el color, de transparente a blanco.  Contina con las nuseas y no obtiene alivio de los remedios indicados. Vomita sangre o algo similar a la borra del caf.  [place] o sube ms de 900 g. en una semana, o segn lo indicado por el profesional que la asiste.  Observa que se le Southwest [employer], las manos, los pies o las piernas.  Ha estado expuesta a la rubola y no ha sufrido la enfermedad.  Ha estado expuesta a la quinta enfermedad o a la varicela.  Presenta dolor abdominal. Las molestias en el ligamento redondo son Neomia Dear causa no cancerosa (benigna) frecuente de dolor abdominal durante el embarazo. El profesional que la asiste deber evaluarla.  Presenta dolor de cabeza intenso que no se [place].  Presenta fiebre, diarrea, dolor al orinar o le falta la respiracin.  Presenta dificultad para ver, visin borrosa, o visin doble.  Sufre una cada, un accidente de trnsito o cualquier tipo de trauma.  Vive en un hogar en el que existe violencia fsica o mental. Document Released: 10/10/2004 Document Revised: 03/25/2011 [facility] Patient Information 2013 [place], [place]. Dolor de Merchandiser, retail  (Back Pain in Pregnancy)  El dolor de espalda es habitual durante el embarazo. Ocurre en aproximadamente la mitad de todos los [place]. Es importante para usted y su beb que permanezca activa durante el [place].Si siente que Chief Technology Officer de espalda es lo que no le permite mantenerse activa o dormir bien, Scientist, clinical (histocompatibility and immunogenetics) a su mdico. La causa del dolor de espalda puede deberse a varios factores relacionados con los cambios durante  el [place].Afortunadamente, excepto que haya tenido problemas de espalda antes del [place], es probable que el dolor mejore despus del [place]. El dolor lumbar por lo general ocurre entre el quinto y sptimo mes del Psychiatrist. Sin embargo, puede ocurrir [employer] primeros meses. Otros factores que aumentan el riesgo son:   Problemas previos en la espalda.  Lesiones en la espalda.  Tener gemelos o embarazos mltiples.  Tos persistente.  El estrs.  Movimientos repetitivos relacionados con Kathie Dike.  Enfermedad muscular o de la columna vertebral en la espalda.  Antecedentes familiares de problemas de espalda, rotura (hernia) de discos u osteoporosis.  Depresin, ansiedad y crisis de [place]. CAUSAS   En las embarazadas, el cuerpo produce una hormona llamada relaxina. Esta hormona hace que los ligamentos que conectan la zona lumbar y los huesos del pubis sean ms flexibles. Esta flexibilidad permite que el beb nazca  con ms facilidad. Cuando los ligamentos estn relajados, los msculos tienen que trabajar ms para apoyar la espalda. El dolor en la espalda puede deberse al cansancio muscular. El dolor tambin puede tener su causa en la irritacin de los tejidos de a espalda que se irritan ya que estn recibiendo menos apoyo.  A medida que el beb crece, ejerce presin United Stationers nervios y los vasos sanguneos de la pelvis. Esto causa dolor de espalda.  A medida que el beb crece y [address] durante el [place], el tero presiona los msculos del estmago hacia adelante y [place] su centro de gravedad. Esto hace que los msculos de la espalda deban trabajar ms para mantener una buena [place]. SNTOMAS  Dolor lumbar durante el embarazo Generalmente se produce en la zona o por arriba de la cintura en el centro de la espalda. Puede haber dolor y entumecimiento que se irradia hacia la pierna o el pie. Es similar al dolor de espalda baja experimentada por las mujeres no embarazadas. Por  lo general, aumenta al UnitedHealth de pie o sentada por largos perodos de [place], o con levantamientos repetitivos Tambin puede haber sensibilidad en los msculos en la zona superior de la espalda .  Dolor plvico posterior Environmental consultant en la parte posterior de la pelvis es ms frecuente que el dolor lumbar en el embarazo. Se trata de un dolor profundo que se siente a un lado en la cintura, o a travs del cxis (sacro), o en ambos lugares. Puede sentir dolor en uno o ambos lados Este dolor tambin puede sentirse en las nalgas y el dorso de los muslos Tambin puede haber dolor pbico y en la ingle. El dolor no se mejora rpidamente con el reposo, y [place] puede haber rigidez matutina. Muchas actividades pueden causarlo. Un buen estado fsico antes y [address] puede o no prevenir este problema. Las contracciones del parto suelen aparecer cada 1 a 2 minutos, tienen una duracin de aproximadamente 1 minuto, e implica una sensacin de empujar o presin en la pelvis. Sin embargo, si usted est a trmino con Firefighter, Chief Technology Officer constante en la zona lumbar puede indicar el comienzo de un parto prematuro, y usted debe ser consciente de ello.  DIAGNSTICO  No se deben tomar radiografas de la [employer] las primeras 12 a 14 semanas del embarazo y durante el resto del Psychiatrist, slo cuando sea absolutamente necesario. La resonancia magntica no emite radiacin y es un estudio seguro durante el Psychiatrist. Pero tambin se deben hacer solamente cuando sea absolutamente necesario.  INSTRUCCIONES PARA EL CUIDADO EN EL HOGAR   Realice actividad fsica segn las indicaciones del mdico. El ejercicio es la manera ms eficaz para prevenir o tratar Chief Technology Officer de espalda. Si tiene un problema en la espalda, es especialmente importante evitar los deportes que requieran de movimientos corporales rpidos. La natacin y las caminatas son las mejores [address].  No permanezca sentada o de pie en el  mismo lugar durante largos perodos.  No use tacos altos.  Sintese en la silla con una buena postura. Use una almohada en su espalda baja si es necesario. Asegrese de que su cabeza descansa sobre sus hombros y no est colgando hacia delante.  Trate de dormir de lado, de preferencia el lado izquierdo, con una o [employer] piernas. Si est dolorida despus de una noche de descanso, la cama puede ser [employer].Trate de colocar una tabla entre el colchn y el somier.  Prstele atencin a su cuerpo cuando se levante.Si siente dolor,pida ayuda o trate de doblar las rodillas ms para [employer] de las piernas en lugar de los msculos de la espalda. Pngase en cuclillas al levantar algo del suelo. No se doble.  Consuma una dieta saludable. Trate de aumentar de peso dentro de las recomendaciones de su mdico.  Utilice compresas de calor o fro de 3 a 4 veces al da durante 15 minutos para Primary school teacher.  Solo tome medicamentos que se pueden comprar sin receta o recetados para Chief Technology Officer, Dentist o fiebre, como le indica el mdico. Dolor de espaldas repentino (agudo).  Haga reposo en cama slo en caso de los episodios ms extremos y agudos de Engineer, mining. El reposo prolongado en cama de ms de 48 horas agravar su trastorno.  El hielo es muy efectivo en los problemas agudos.  Ponga el hielo en una bolsa plstica.  Colquese una toalla entre la piel y la bolsa de hielo.  Deje el hielo durante 10 a 20 minutos cada 2 horas o segn lo nesecite, mientras se encuentre despierta.  Las compresas de calor durante 30 minutos antes de las actividades tambin puede ayudar. Dolor crnico en la espalda. Consulte a su mdico si el dolor es continuo. El mdico podr ayudarla o derivarla para que realice los ejercicios y trabajos de fortalecimiento apropiados. Con un buen entrenamiento fsico, podr evitar la mayor parte de los [place]. En algunos casos, la causa es un problema ms  grave. Debe ser controlada inmediatamente si aparecen nuevos problemas. El mdico tambin podr recomendar:   Una faja de maternidad.  Un arns elstico.  Un cors para la espalda.  Un masajista o acupuntura. SOLICITE ATENCIN MDICA SI:   No puede Careers information officer de sus actividades diarias, an tomando los medicamentos para Psychologist, occupational.  Beverlee Nims ser derivada a un fisioterapeuta o quiroprxico.  Beverlee Nims intentar con acupuntura. SOLICITE ATENCIN MDICA DE INMEDIATO SI:   Siente entumecimiento, hormigueo, debilidad o problemas con el uso de los brazos o las piernas.  Siente un dolor de espalda muy intenso que no se alivia con medicamentos.  Tiene modificaciones repentinas en el control de la vejiga o el intestino.  Aumenta el dolor en otras partes del cuerpo.  Siente que le falta el aire, se marea o sufre un [place].  Tiene nuseas, vmitos o sudoracin.  Siente un dolor en la espalda similar al del [place].  Cuando aparece [employer]els, rompe la bolsa de aguas o tiene un sangrado vaginal.  El dolor o el adormecimiento se extienden hacia la pierna.  El dolor aparece despus de una cada.  Siente dolor de un solo lado. Podra tener clculos renales.  Observa sangre en la orina. Podra tener una infeccin en la vejiga o clculos renales.  Siente dolor y aparecen ronchas. Podra tener culebrilla. El dolor de espalda es bastante frecuente durante el embarazo pero no debe aceptarse slo como parte del [place]. Siempre debe tratarse lo ms rpidamente posible. Har que su embarazo sea lo ms placentero posible.  Document Released: 09/12/2010 Document Revised: 03/25/2011 [facility] Patient Information 2013 [place], [place].

## 2012-03-02 NOTE — Progress Notes (Signed)
Pulse: 86 Needs to see nutrition and social work

## 2012-03-02 NOTE — Progress Notes (Signed)
Nutrition note: 1st visit consult Pt has h/o wt loss during 1st trimester. Pt has lost 4.4# @ [redacted]w[redacted]d.  Pt reports she had severe N&V in the beginning but has resolved now. Pt reports having a little heartburn but medicine helps. Pt is taking a PNV. Pt is allergic to shrimp. Pt reports eating 2 meals & 2-3 snacks/d.  Pt reports she is getting very little physical activity. Pt received verbal & written education on general nutrition during pregnancy in Spanish via translator. Disc tips to increase wt gain & encouraged pt to include a protein source with all meals & snacks.  Disc wt gain goals of 25-35# or 1#/wk. Pt agrees to continue to take PNV & eat calorie dense snacks. Pt has WIC & plans to BF. F/u in 4-6 wks Blondell Reveal, MS, RD, LDN

## 2012-03-02 NOTE — Progress Notes (Signed)
   Subjective:    Jacqueline Graves is a [id number] [redacted]w[redacted]d being seen today for her first obstetrical visit.  Uncomplicated OB history. Patient does intend to breast feed. States she had difficulty with latch when nursing previous babies. Pregnancy history fully reviewed.  Patient reports backache, heartburn, nausea, no bleeding, no contractions and no cramping.  Filed Vitals:   03/02/12 1011  BP: 115/69  Weight: 140 lb 9.6 oz (63.776 kg)    HISTORY: OB History   Grav Para Term Preterm Abortions TAB SAB Ect Mult Living   4 2 2  1  1   2      # Outc Date GA Lbr Len/2nd Wgt Sex Del Anes PTL Lv   1 TRM 6/07 [redacted]w[redacted]d   F SVD   Yes   Comments: Born at [facility]   2 TRM 10/08 [redacted]w[redacted]d   F SVD   Yes   3 SAB  [redacted]w[redacted]d          4 CUR              Past Medical History  Diagnosis Date  . Medical history non-contributory    History reviewed. No pertinent past surgical history. History reviewed. No pertinent family history.   Exam    Uterus:     Pelvic Exam:    Perineum: No Hemorrhoids, Normal Perineum   Vulva: normal, Bartholin's, Urethra, Skene's normal   Vagina:  normal mucosa, normal discharge       Cervix: multiparous appearance and no lesions   Adnexa: no mass, fullness, tenderness   Bony Pelvis: proven to 9#  System:     Skin: normal coloration and turgor, no rashes    Neurologic: oriented, normal   Extremities: normal strength, tone, and muscle mass       Mouth/Teeth mucous membranes moist, pharynx normal without lesions       Cardiovascular: regular rate and rhythm   Respiratory:  appears well, vitals normal, no respiratory distress, acyanotic, normal RR   Abdomen: soft, non-tender; bowel sounds normal; no masses,  no organomegaly   Urinary: urethral meatus normal      Assessment:    Pregnancy: [id number] Patient Active Problem List  Diagnosis  . Supervision of normal intrauterine pregnancy in multigravida        Plan:     Initial labs drawn. Prenatal  vitamins. Reglan for n/v. Ranitidine for heartburn.  Rev'd comfort measures for back pain.  Problem list reviewed and updated. Genetic Screening discussed Quad Screen: declined.  Ultrasound discussed; fetal survey: ordered.  Follow up in 4 weeks. Precautions rev'd   Barb Shear 03/02/2012

## 2012-03-02 NOTE — Progress Notes (Signed)
U/S scheduled 03/16/12 at 315pm.

## 2012-03-03 ENCOUNTER — Encounter: Payer: Self-pay | Admitting: *Deleted

## 2012-03-03 LAB — OBSTETRIC PANEL
Hemoglobin: 10.3 g/dL — ABNORMAL LOW (ref 12.0–15.0)
Hepatitis B Surface Ag: NEGATIVE
Lymphocytes Relative: 18 % (ref 12–46)
Lymphs Abs: 1.4 10*3/uL (ref 0.7–4.0)
Monocytes Relative: 8 % (ref 3–12)
Neutro Abs: 5.6 10*3/uL (ref 1.7–7.7)
Neutrophils Relative %: 73 % (ref 43–77)
Platelets: 280 10*3/uL (ref 150–400)
RBC: 3.43 MIL/uL — ABNORMAL LOW (ref 3.87–5.11)
Rh Type: POSITIVE
Rubella: 14.1 Index — ABNORMAL HIGH (ref <0.90)
WBC: 7.6 10*3/uL (ref 4.0–10.5)

## 2012-03-03 LAB — CULTURE, OB URINE: Colony Count: 40000

## 2012-03-03 LAB — POCT URINALYSIS DIP (DEVICE)
Bilirubin Urine: NEGATIVE
Glucose, UA: NEGATIVE mg/dL
Hgb urine dipstick: NEGATIVE
Ketones, ur: NEGATIVE mg/dL
Specific Gravity, Urine: 1.025 (ref 1.005–1.030)
Urobilinogen, UA: 1 mg/dL (ref 0.0–1.0)

## 2012-03-16 ENCOUNTER — Ambulatory Visit (HOSPITAL_COMMUNITY)
Admission: RE | Admit: 2012-03-16 | Discharge: 2012-03-16 | Disposition: A | Discharge status: Home / Self Care | Payer: Self-pay | Source: Ambulatory Visit | Attending: Advanced Practice Midwife | Admitting: Advanced Practice Midwife

## 2012-03-16 DIAGNOSIS — Z363 Encounter for antenatal screening for malformations: Secondary | ICD-10-CM | POA: Insufficient documentation

## 2012-03-16 DIAGNOSIS — Z1389 Encounter for screening for other disorder: Secondary | ICD-10-CM | POA: Insufficient documentation

## 2012-03-16 DIAGNOSIS — O358XX0 Maternal care for other (suspected) fetal abnormality and damage, not applicable or unspecified: Secondary | ICD-10-CM | POA: Insufficient documentation

## 2012-03-16 DIAGNOSIS — Z3480 Encounter for supervision of other normal pregnancy, unspecified trimester: Secondary | ICD-10-CM

## 2012-04-01 ENCOUNTER — Encounter: Payer: Self-pay | Admitting: Advanced Practice Midwife

## 2012-04-01 ENCOUNTER — Ambulatory Visit (INDEPENDENT_AMBULATORY_CARE_PROVIDER_SITE_OTHER): Payer: Self-pay | Admitting: Advanced Practice Midwife

## 2012-04-01 ENCOUNTER — Other Ambulatory Visit: Payer: Self-pay | Admitting: Advanced Practice Midwife

## 2012-04-01 VITALS — BP 117/71 | Temp 98.8°F | Wt 147.0 lb

## 2012-04-01 DIAGNOSIS — Z3480 Encounter for supervision of other normal pregnancy, unspecified trimester: Secondary | ICD-10-CM

## 2012-04-01 LAB — POCT URINALYSIS DIP (DEVICE)
Bilirubin Urine: NEGATIVE
Glucose, UA: NEGATIVE mg/dL
Ketones, ur: NEGATIVE mg/dL
Leukocytes, UA: NEGATIVE
Nitrite: NEGATIVE

## 2012-04-01 NOTE — Patient Instructions (Signed)
Pregnancy - Second Trimester The second trimester of pregnancy (3 to 6 months) is a period of rapid growth for you and your baby. At the end of the sixth month, your baby is about 9 inches long and weighs 1 1/2 pounds. You will begin to feel the baby move between 18 and 20 weeks of the pregnancy. This is called quickening. Weight gain is faster. A clear fluid (colostrum) may leak out of your breasts. You may feel small contractions of the womb (uterus). This is known as false labor or Braxton-Hicks contractions. This is like a practice for labor when the baby is ready to be born. Usually, the problems with morning sickness have usually passed by the end of your first trimester. Some women develop small dark blotches (called cholasma, mask of pregnancy) on their face that usually goes away after the baby is born. Exposure to the sun makes the blotches worse. Acne may also develop in some pregnant women and pregnant women who have acne, may find that it goes away. PRENATAL EXAMS  Blood work may continue to be done during prenatal exams. These tests are done to check on your health and the probable health of your baby. Blood work is used to follow your blood levels (hemoglobin). Anemia (low hemoglobin) is common during pregnancy. Iron and vitamins are given to help prevent this. You will also be checked for diabetes between 24 and 28 weeks of the pregnancy. Some of the previous blood tests may be repeated.  The size of the uterus is measured during each visit. This is to make sure that the baby is continuing to grow properly according to the dates of the pregnancy.  Your blood pressure is checked every prenatal visit. This is to make sure you are not getting toxemia.  Your urine is checked to make sure you do not have an infection, diabetes or protein in the urine.  Your weight is checked often to make sure gains are happening at the suggested rate. This is to ensure that both you and your baby are growing  normally.  Sometimes, an ultrasound is performed to confirm the proper growth and development of the baby. This is a test which bounces harmless sound waves off the baby so your caregiver can more accurately determine due dates. Sometimes, a specialized test is done on the amniotic fluid surrounding the baby. This test is called an amniocentesis. The amniotic fluid is obtained by sticking a needle into the belly (abdomen). This is done to check the chromosomes in instances where there is a concern about possible genetic problems with the baby. It is also sometimes done near the end of pregnancy if an early delivery is required. In this case, it is done to help make sure the baby's lungs are mature enough for the baby to live outside of the womb. CHANGES OCCURING IN THE SECOND TRIMESTER OF PREGNANCY Your body goes through many changes during pregnancy. They vary from person to person. Talk to your caregiver about changes you notice that you are concerned about.  During the second trimester, you will likely have an increase in your appetite. It is normal to have cravings for certain foods. This varies from person to person and pregnancy to pregnancy.  Your lower abdomen will begin to bulge.  You may have to urinate more often because the uterus and baby are pressing on your bladder. It is also common to get more bladder infections during pregnancy (pain with urination). You can help this by   drinking lots of fluids and emptying your bladder before and after intercourse.  You may begin to get stretch marks on your hips, abdomen, and breasts. These are normal changes in the body during pregnancy. There are no exercises or medications to take that prevent this change.  You may begin to develop swollen and bulging veins (varicose veins) in your legs. Wearing support hose, elevating your feet for 15 minutes, 3 to 4 times a day and limiting salt in your diet helps lessen the problem.  Heartburn may develop  as the uterus grows and pushes up against the stomach. Antacids recommended by your caregiver helps with this problem. Also, eating smaller meals 4 to 5 times a day helps.  Constipation can be treated with a stool softener or adding bulk to your diet. Drinking lots of fluids, vegetables, fruits, and whole grains are helpful.  Exercising is also helpful. If you have been very active up until your pregnancy, most of these activities can be continued during your pregnancy. If you have been less active, it is helpful to start an exercise program such as walking.  Hemorrhoids (varicose veins in the rectum) may develop at the end of the second trimester. Warm sitz baths and hemorrhoid cream recommended by your caregiver helps hemorrhoid problems.  Backaches may develop during this time of your pregnancy. Avoid heavy lifting, wear low heal shoes and practice good posture to help with backache problems.  Some pregnant women develop tingling and numbness of their hand and fingers because of swelling and tightening of ligaments in the wrist (carpel tunnel syndrome). This goes away after the baby is born.  As your breasts enlarge, you may have to get a bigger bra. Get a comfortable, cotton, support bra. Do not get a nursing bra until the last month of the pregnancy if you will be nursing the baby.  You may get a dark line from your belly button to the pubic area called the linea nigra.  You may develop rosy cheeks because of increase blood flow to the face.  You may develop spider looking lines of the face, neck, arms and chest. These go away after the baby is born. HOME CARE INSTRUCTIONS   It is extremely important to avoid all smoking, herbs, alcohol, and unprescribed drugs during your pregnancy. These chemicals affect the formation and growth of the baby. Avoid these chemicals throughout the pregnancy to ensure the delivery of a healthy infant.  Most of your home care instructions are the same as  suggested for the first trimester of your pregnancy. Keep your caregiver's appointments. Follow your caregiver's instructions regarding medication use, exercise and diet.  During pregnancy, you are providing food for you and your baby. Continue to eat regular, well-balanced meals. Choose foods such as meat, fish, milk and other low fat dairy products, vegetables, fruits, and whole-grain breads and cereals. Your caregiver will tell you of the ideal weight gain.  A physical sexual relationship may be continued up until near the end of pregnancy if there are no other problems. Problems could include early (premature) leaking of amniotic fluid from the membranes, vaginal bleeding, abdominal pain, or other medical or pregnancy problems.  Exercise regularly if there are no restrictions. Check with your caregiver if you are unsure of the safety of some of your exercises. The greatest weight gain will occur in the last 2 trimesters of pregnancy. Exercise will help you:  Control your weight.  Get you in shape for labor and delivery.  Lose weight   after you have the baby.  Wear a good support or jogging bra for breast tenderness during pregnancy. This may help if worn during sleep. Pads or tissues may be used in the bra if you are leaking colostrum.  Do not use hot tubs, steam rooms or saunas throughout the pregnancy.  Wear your seat belt at all times when driving. This protects you and your baby if you are in an accident.  Avoid raw meat, uncooked cheese, cat litter boxes and soil used by cats. These carry germs that can cause birth defects in the baby.  The second trimester is also a good time to visit your dentist for your dental health if this has not been done yet. Getting your teeth cleaned is OK. Use a soft toothbrush. Brush gently during pregnancy.  It is easier to loose urine during pregnancy. Tightening up and strengthening the pelvic muscles will help with this problem. Practice stopping your  urination while you are going to the bathroom. These are the same muscles you need to strengthen. It is also the muscles you would use as if you were trying to stop from passing gas. You can practice tightening these muscles up 10 times a set and repeating this about 3 times per day. Once you know what muscles to tighten up, do not perform these exercises during urination. It is more likely to contribute to an infection by backing up the urine.  Ask for help if you have financial, counseling or nutritional needs during pregnancy. Your caregiver will be able to offer counseling for these needs as well as refer you for other special needs.  Your skin may become oily. If so, wash your face with mild soap, use non-greasy moisturizer and oil or cream based makeup. MEDICATIONS AND DRUG USE IN PREGNANCY  Take prenatal vitamins as directed. The vitamin should contain 1 milligram of folic acid. Keep all vitamins out of reach of children. Only a couple vitamins or tablets containing iron may be fatal to a baby or young child when ingested.  Avoid use of all medications, including herbs, over-the-counter medications, not prescribed or suggested by your caregiver. Only take over-the-counter or prescription medicines for pain, discomfort, or fever as directed by your caregiver. Do not use aspirin.  Let your caregiver also know about herbs you may be using.  Alcohol is related to a number of birth defects. This includes fetal alcohol syndrome. All alcohol, in any form, should be avoided completely. Smoking will cause low birth rate and premature babies.  Street or illegal drugs are very harmful to the baby. They are absolutely forbidden. A baby born to an addicted mother will be addicted at birth. The baby will go through the same withdrawal an adult does. SEEK MEDICAL CARE IF:  You have any concerns or worries during your pregnancy. It is better to call with your questions if you feel they cannot wait, rather  than worry about them. SEEK IMMEDIATE MEDICAL CARE IF:   An unexplained oral temperature above 102 F (38.9 C) develops, or as your caregiver suggests.  You have leaking of fluid from the vagina (birth canal). If leaking membranes are suspected, take your temperature and tell your caregiver of this when you call.  There is vaginal spotting, bleeding, or passing clots. Tell your caregiver of the amount and how many pads are used. Light spotting in pregnancy is common, especially following intercourse.  You develop a bad smelling vaginal discharge with a change in the color from clear   to white.  You continue to feel sick to your stomach (nauseated) and have no relief from remedies suggested. You vomit blood or coffee ground-like materials.  You lose more than 2 pounds of weight or gain more than 2 pounds of weight over 1 week, or as suggested by your caregiver.  You notice swelling of your face, hands, feet, or legs.  You get exposed to German measles and have never had them.  You are exposed to fifth disease or chickenpox.  You develop belly (abdominal) pain. Round ligament discomfort is a common non-cancerous (benign) cause of abdominal pain in pregnancy. Your caregiver still must evaluate you.  You develop a bad headache that does not go away.  You develop fever, diarrhea, pain with urination, or shortness of breath.  You develop visual problems, blurry, or double vision.  You fall or are in a car accident or any kind of trauma.  There is mental or physical violence at home. Document Released: 12/25/2000 Document Revised: 03/25/2011 Document Reviewed: 06/29/2008 [employer] Patient Information 2013 [employer].  

## 2012-04-01 NOTE — Progress Notes (Signed)
Pulse- 88  Pressure-"epigastric"  Vaginal discharge- sometimes yellow, or white

## 2012-04-01 NOTE — Progress Notes (Signed)
Reviewed anatomy US, all normal but poor visualization of heart and other areas. Will schedule followup in a week. No complaints.

## 2012-04-08 ENCOUNTER — Ambulatory Visit (HOSPITAL_COMMUNITY): Payer: Self-pay

## 2012-04-13 ENCOUNTER — Ambulatory Visit (HOSPITAL_COMMUNITY)
Admission: RE | Admit: 2012-04-13 | Discharge: 2012-04-13 | Disposition: A | Discharge status: Home / Self Care | Payer: Self-pay | Source: Ambulatory Visit | Attending: Advanced Practice Midwife | Admitting: Advanced Practice Midwife

## 2012-04-13 DIAGNOSIS — Z3689 Encounter for other specified antenatal screening: Secondary | ICD-10-CM | POA: Insufficient documentation

## 2012-04-29 ENCOUNTER — Encounter: Payer: Self-pay | Admitting: Family Medicine

## 2012-05-13 ENCOUNTER — Encounter: Payer: Self-pay | Admitting: Obstetrics & Gynecology

## 2012-05-13 ENCOUNTER — Ambulatory Visit (INDEPENDENT_AMBULATORY_CARE_PROVIDER_SITE_OTHER): Payer: Self-pay | Admitting: Obstetrics & Gynecology

## 2012-05-13 VITALS — BP 113/72 | Temp 98.0°F | Wt 154.5 lb

## 2012-05-13 DIAGNOSIS — Z3480 Encounter for supervision of other normal pregnancy, unspecified trimester: Secondary | ICD-10-CM

## 2012-05-13 LAB — POCT URINALYSIS DIP (DEVICE)
Bilirubin Urine: NEGATIVE
Glucose, UA: NEGATIVE mg/dL
Hgb urine dipstick: NEGATIVE
Ketones, ur: NEGATIVE mg/dL
Nitrite: NEGATIVE
Protein, ur: NEGATIVE mg/dL
Specific Gravity, Urine: 1.02 (ref 1.005–1.030)
Urobilinogen, UA: 0.2 mg/dL (ref 0.0–1.0)
pH: 7 (ref 5.0–8.0)

## 2012-05-13 LAB — CBC
HCT: 33.4 % — ABNORMAL LOW (ref 36.0–46.0)
Hemoglobin: 11.1 g/dL — ABNORMAL LOW (ref 12.0–15.0)
MCH: 30.2 pg (ref 26.0–34.0)
MCHC: 33.2 g/dL (ref 30.0–36.0)
MCV: 91 fL (ref 78.0–100.0)
Platelets: 269 K/uL (ref 150–400)
RBC: 3.67 MIL/uL — ABNORMAL LOW (ref 3.87–5.11)
RDW: 13.7 % (ref 11.5–15.5)
WBC: 9 K/uL (ref 4.0–10.5)

## 2012-05-13 NOTE — Patient Instructions (Signed)
Levonorgestrel intrauterine device (IUD) Qu es este medicamento? El LEVONORGESTREL (DIU) es un dispositivo anticonceptivo (control de natalidad). Se utiliza para evitar el embarazo durante un perodo de hasta 5 aos. Este medicamento puede ser utilizado para otros usos; si tiene alguna pregunta consulte con su proveedor de atencin mdica o con su farmacutico. Qu le debo informar a mi profesional de la salud antes de tomar este medicamento? Necesita saber si usted presenta alguno de los siguientes problemas o situaciones: -exmen de Papanicolaou anormal -cncer de mama, cuello del tero o tero -diabetes -endometritis -si tiene una infeccin plvica o genital actual o en el pasado -tiene ms de una pareja sexual o si su pareja tiene ms de una pareja -enfermedad cardiaca -antecedente de embarazo tubrico o ectpico -problemas del sistema inmunolgico -DIU colocado -enfermedad heptica o tumor del hgado -problemas con la coagulacin o si toma diluyentes sanguneos -usa medicamentos intravenoso -forma inusual del tero -sangrado vaginal que no tiene explicacin -una reaccin alrgica o inusual al levonorgestrel, a otras hormonas, a la silicona o polietilenos, a otros medicamentos, alimentos, colorantes o conservantes -si est embarazada o buscando quedar embarazada -si est amamantando a un beb Cmo debo utilizar este medicamento? Un profesional de la salud coloca este dispositivo en el tero. Hable con su pediatra para informarse acerca del uso de este medicamento en nios. Puede requerir atencin especial. Sobredosis: Pngase en contacto inmediatamente con un centro toxicolgico o una sala de urgencia si usted cree que haya tomado demasiado medicamento. ATENCIN: Este medicamento es solo para usted. No comparta este medicamento con nadie. Qu sucede si me olvido de una dosis? No se aplica en este caso. Qu puede interactuar con este medicamento? No tome esta medicina con  ninguno de los siguientes medicamentos: -amprenavir -bosentano -fosamprenavir Esta medicina tambin puede interactuar con los siguientes medicamentos: -aprepitant -barbitricos para producir el sueo o para el tratamiento de convulsiones -bexaroteno -griseofulvina -medicamentos para tratar los convulsiones, tales como carbamazepina, etotona, felbamato, oxcarbazepina, fenitona, topiramato -modafinilo -pioglitazona -rifabutina -rifampicina -rifapentina -algunos medicamentos para tratar el virus VIH, tales como atazanavir, indinavir, lopinavir, nelfinavir, tipranavir, ritonavir -hierba de [place] -warfarina Puede ser que esta lista no menciona todas las posibles interacciones. Informe a su profesional de la salud de todos los productos a base de hierbas, medicamentos de venta libre o suplementos nutritivos que est tomando. Si usted fuma, consume bebidas alcohlicas o si utiliza drogas ilegales, indqueselo tambin a su profesional de la salud. Algunas sustancias pueden interactuar con su medicamento. A qu debo estar atento al usar este medicamento? Visite a su mdico o a su profesional de la salud para chequear su evolucin peridicamente. Visite a su mdico si usted o su pareja tiene relaciones sexuales con otras personas, se vuelve VIH positivo o contrae una enfermedad de transmisin sexual. Este medicamento no la protege de la infeccin por VIH (SIDA) ni de ninguna otra enfermedad de transmisin sexual. Puede controlar la ubicacin del DIU usted misma palpando con sus dedos limpios los hilos en la parte anterior de la vagina. No tire de los hilos. Es un buen hbito controlar la ubicacin del dispositivo despus de cada perodo menstrual. Si no slo siente los hilos sino que adems siente otra parte ms del DIU o si no puede sentir los hilos, consulte a su mdico inmediatamente. El DIU puede salirse por s solo. Puede quedar embarazada si el dispositivo se sale de su lugar. Utilice un  mtodo anticonceptivo adicional, como preservativos, y consulte a su proveedor de atencin mdica s observa que   el DIU se sali de Nature conservation officer. La utilizacin de tampones no cambia la posicin del DIU y no hay inconvenientes en usarlos durante su perodo. Qu efectos secundarios puedo tener al [employer] este medicamento? Efectos secundarios que debe informar a su mdico o a Producer, television/film/video de la salud tan pronto como sea posible: -Therapist, art como erupcin cutnea, picazn o urticarias, hinchazn de la cara, labios o lengua -fiebre, sntomas gripales -llagas genitales -alta presin sangunea -ausencia de un perodo menstrual durante 6 semanas mientras lo utiliza -Engineer, mining, Public librarian en las piernas -dolor o sensibilidad del plvico -dolor de cabeza repentino o severo -signos de Psychiatrist -calambres estomacales -falta de aliento repentina -problemas de coordinacin, del habla, al caminar -sangrado, flujo vaginal inusual -color amarillento de los ojos o la piel Efectos secundarios que, por lo general, no requieren atencin mdica (debe informarlos a su mdico o a su profesional de la salud si persisten o si son molestos): -acn -dolor de pecho -cambios en el deseo sexual o capacidad -cambios de peso -calambres, Research scientist (life sciences) o sensacin de [employer] se introduce el dispositivo -dolor de cabeza -sangrado menstruales irregulares en los primeros 3 a 6 meses de usar -nuseas Puede ser que esta lista no menciona todos los posibles efectos secundarios. Comunquese a su mdico por asesoramiento mdico [employer]. Usted puede informar los efectos secundarios a la FDA por telfono al 1-800-FDA-1088. Dnde debo guardar mi medicina? No se aplica en este caso. ATENCIN: Este folleto es un resumen. Puede ser que no cubra toda la posible informacin. Si usted tiene preguntas acerca de esta medicina, consulte con su mdico, su farmacutico o su profesional de Radiographer, therapeutic.   2012, Elsevier/Gold Standard. (03/14/2008 3:17:12 PM)Etonogestrel implant [place] es este medicamento? El ETONOGESTREL es un dispositivo anticonceptivo (control de la natalidad). Se utiliza para Neurosurgeon. Se puede utilizar hasta 3 aos. Este medicamento puede ser utilizado para otros usos; si tiene alguna pregunta consulte con su proveedor de atencin mdica o con su farmacutico. Qu le debo informar a mi profesional de la salud antes de tomar este medicamento? Necesita saber si usted presenta alguno de los siguientes problemas o situaciones: -sangrado vaginal anormal -enfermedad vascular o cogulos sanguneos -cncer de mama, cervical, heptico -depresin -diabetes -enfermedad de la vescula biliar -dolores de cabeza -enfermedad cardiaca o ataque cardiaco reciente -alta presin sangunea -alto nivel de colesterol -enfermedad renal -enfermedad heptica -convulsiones -fuma tabaco -una reaccin alrgica o inusual al etonogestrel, otras hormonas, anestsicos o antispticos, medicamentos, alimentos, colorantes o conservantes -si est embarazada o buscando quedar embarazada -si est amamantando a un beb Cmo debo utilizar este medicamento? Este dispositivo se inserta debajo de la piel en la cara interna de la parte superior del brazo por un profesional de Radiographer, therapeutic. Hable con su pediatra para informarse acerca del uso de este medicamento en nios. Puede requerir atencin especial. Sobredosis: Pngase en contacto inmediatamente con un centro toxicolgico o una sala de urgencia si usted cree que haya tomado demasiado medicamento. ATENCIN: [employer] es solo para usted. No comparta este medicamento con nadie. Qu sucede si me olvido de una dosis? No se aplica en este caso. Qu puede interactuar con este medicamento? No tome esta medicina con ninguno de los siguientes medicamentos: -amprenavir -bosentano -fosamprenavir  Esta medicina tambin puede interactuar con los  siguientes medicamentos: -medicamentos barbitricos para inducir el sueo o tratar convulsiones -ciertos medicamentos para las infecciones micticas tales como quetoconazol e itraconazol -griseofulvina -medicamentos para tratar convulsiones, tales como carbamazepina, felbamato, [place],  fenitona, topiramato -modafinil -fenilbutazona -rifampicina -algunos medicamentos para tratar la infeccin por VIH tales como atazanavir, indinavir, lopinavir, nelfinavir, tipranavir, ritonavir -hierba de [address] ser que esta lista no menciona todas las posibles interacciones. Informe a su profesional de [employer] de [employer] productos a base de hierbas, medicamentos de [place] o suplementos nutritivos que est tomando. Si usted fuma, consume bebidas alcohlicas o si utiliza drogas ilegales, indqueselo tambin a su profesional de [employer]. Algunas sustancias pueden interactuar con su medicamento. A qu debo estar atento al usar [employer]? Este medicamento no protege contra la infeccin por el VIH (SIDA) o otras enfermedades de transmisin sexual. Usted debe sentir el implante al presionar con las yemas de los dedos sobre la piel donde se insert. Dgale a su mdico si no se siente el implante. Qu efectos secundarios puedo tener al [employer] este medicamento? Efectos secundarios que debe informar a su mdico o a Producer, television/film/video de la salud tan pronto como sea posible: -Therapist, art como erupcin cutnea, picazn o urticarias, hinchazn de la cara, labios o lengua -ndulos mamarios -cambios en la visin -confusin, dificultad para hablar o entender -orina de color oscura -humor deprimido -sensacin general de estar enfermo o sntomas gripales -heces claras -prdida del apetito, nuseas -dolor en la regin abdominal superior derecha -dolores de Advertising account executive, hinchazon o sensibilidad grave en el abdomen -falta de aliento, dolor en el pecho, hinchazn de la  pierna -seales de Chartered loss adjuster -entumecimiento o debilidad repentina de la cara, brazo o pierna -dificultad para caminar, mareos, prdida de equilibrio o coordinacin -sangrado o flujo vaginal inusual -cansancio o debilidad inusual -color amarillento de los ojos o la piel  Efectos secundarios que, por lo general, no requieren Psychologist, prison and probation services (debe informarlos a su mdico o a Producer, television/film/video de la salud si persisten o si son molestos): -acn -dolor de pecho -cambios de peso -tos -fiebre o escalofros -dolor de cabeza -sangrado menstrual irregular -picazn, ardo o flujo vaginal -dolor o dificultad para Geographical information systems officer -dolor de garganta Puede ser que esta lista no menciona todos los posibles efectos secundarios. Comunquese a su mdico por asesoramiento mdico [employer]. Usted puede informar los efectos secundarios a la FDA por telfono al 1-800-FDA-1088. Dnde debo guardar mi medicina? Este medicamento se administra en hospitales o clnicas y no necesitar guardarlo en su domicilio. ATENCIN: Este folleto es un resumen. Puede ser que no cubra toda la posible informacin. Si usted tiene preguntas acerca de esta medicina, consulte con su mdico, su farmacutico o su profesional de Radiographer, therapeutic.  2013, Elsevier/Gold Standard. (04/30/2011 11:44:43 AM) Medroxyprogesterone injection [Contraceptive] Qu es este medicamento? Las inyecciones anticonceptivas de MEDROXIPROGESTERONA previenen Firefighter. Las [employer] brindarn control de la natalidad durante 3 meses. La Depo-subQ Provera 104 se utiliza tambin para tratar [employer] relacionado con endometriosis. Este medicamento puede ser utilizado para otros usos; si tiene alguna pregunta consulte con su proveedor de atencin mdica o con su farmacutico. Qu le debo informar a mi profesional de la salud antes de tomar este medicamento? Necesita saber si usted presenta alguno de los siguientes problemas o situaciones: -si consume  alcohol con frecuencia -asma -enfermedad vascular o antecedente de cogulos sanguneos en los pulmones o las piernas -enfermedad de los [place], como osteoporosis -cncer de mama -diabetes -trastornos de la alimentacin (anorexia nerviosa o bulimia) -alta presin sangunea -infecciones por VIH o SIDA -enfermedad renal -enfermedad heptica -depresin mental -migraa -convulsiones -derrame cerebral -fuma tabaco -sangrado vaginal -una reaccin alrgica o  inusual a la medroxiprogesterona, otras hormonas, otros medicamentos, alimentos, colorantes o conservantes -si est embarazada o buscando quedar embarazada -si est amamantando a un beb Cmo debo utilizar este medicamento? El anticonceptivo de Depo-Provera se inyecta por va intramuscular. La Depo-SubQ Provera 104 se inyecta por va subcutnea. Las [employer] un profesional de Radiographer, therapeutic. Usted no puede estar embarazada antes de recibir una inyeccin. La inyeccin normalmente se aplica durante los primeros 5 das despus de comenzar un perodo menstrual o 6 semanas despus de un parto. Hable con su pediatra para informarse acerca del uso de este medicamento en nios. Puede requerir atencin especial. Estas inyecciones han sido usadas en nias que han empezados a tener perodos [place]. Sobredosis: Pngase en contacto inmediatamente con un centro toxicolgico o una sala de urgencia si usted cree que haya tomado demasiado medicamento. ATENCIN: [employer] es solo para usted. No comparta este medicamento con nadie. Qu sucede si me olvido de una dosis? Trate de no olvidar ninguna dosis. Para mantener el control de natalidad necesitar una inyeccin cada 3 meses. Si no puede asistir a una cita, comunquese con su profesional de la salud para que se la [place]. Si espera ms de 13 semanas entre las inyecciones anticonceptivos de Depo-Provera o ms de 14 semanas entre inyecciones anticonceptivos de Depo-subQ Provera 104, puede quedarse  [place]. Si no puede asistir a su cita utilice otro mtodo anticonceptivo. Tal vez deba hacerse una prueba de embarazo antes de recibir otra inyeccin. Qu puede interactuar con este medicamento? No tome esta medicina con ninguno de los siguientes medicamentos: -bosentano Esta medicina tambin puede interactuar con los siguientes medicamentos: -aminoglutethimide -antibiticos o medicamentos para infecciones, especialmente rifampicina, rifabutina, rifapentina y griseofulvina -aprepitant -barbitricos, tales como el fenobarbital o primidona -bexaroteno -carbamazepina -medicamentos para convulsiones, tales como etotona, felbamato, [place], [place], topiramato -modafinilo -hierba de [address] ser que esta lista no menciona todas las posibles interacciones. Informe a su profesional de [employer] de [employer] productos a base de hierbas, medicamentos de [place] o suplementos nutritivos que est tomando. Si usted fuma, consume bebidas alcohlicas o si utiliza drogas ilegales, indqueselo tambin a su profesional de [employer]. Algunas sustancias pueden interactuar con su medicamento. A qu debo estar atento al usar [employer]? Este medicamento no la protege de la infeccin por VIH (SIDA) ni de otras enfermedades de transmisin sexual. El uso de este producto puede provocar una prdida de calcio de sus huesos. La prdida de calcio puede provocar huesos dbiles (osteoporosis). Slo use este producto durante ms de 2 aos si otras formas de anticonceptivos no son apropiados para usted. Mientre ms tiempo use este producto para el control de la natalidad, tendr ms riesgo de Marine scientist de [employer]. Consulte a su profesional de Contractor de cmo puede [employer] fuertes. Puede experimentar un cambio en el patrn de sangrado o periodos irregulares. Muchas mujeres dejan de tener periodos [employer]. Si recibe sus inyecciones a tiempo, la  posibilidad de quedarse embarazada es muy baja. Si cree que podr [employer], visite a su profesional de la salud lo antes posible. Si desea quedar embarazada dentro del prximo ao, informe a su profesional de [employer]. El [place] de este medicamento puede perdurar durante mucho tiempo despus de recibir su ltima inyeccin. Qu efectos secundarios puedo tener al [employer] este medicamento? Efectos secundarios que debe informar a su mdico o a Producer, television/film/video de la salud tan pronto como sea posible: -reacciones alrgicas como erupcin cutnea, picazn  o urticarias, hinchazn de la cara, labios o lengua -secreciones o sensibilidad de las mamas -problemas respiratorios -cambios en la visin -depresin -sensacin de desmayos o mareos, cadas -fiebre -dolor en el abdomen, pecho, entrepierna o piernas -problemas de coordinacin, del habla, al caminar -cansancio o debilidad inusual -color amarillento de los ojos o la piel Efectos secundarios que, por lo general, no requieren Psychologist, prison and probation services (debe informarlos a mdico o a Producer, television/film/video de la salud si persisten o si son molestos): -cne -retencin de lquidos e hinchazn -dolor de cabeza -perodos menstruales irregulares, manchando o ausencia de perodos menstruales -dolor, picazn o reaccin cutnea temporal en el lugar de la inyeccin -aumento de peso Puede ser que esta lista no menciona todos los posibles efectos secundarios. Comunquese a su mdico por asesoramiento mdico [employer]. Usted puede informar los efectos secundarios a la FDA por telfono al 1-800-FDA-1088. Dnde debo guardar mi medicina? No se aplica en este caso. Un profesional de Associate Professor las inyecciones. ATENCIN: Este folleto es un resumen. Puede ser que no cubra toda la posible informacin. Si usted tiene preguntas acerca de esta medicina, consulte con su mdico, su farmacutico o su profesional de Radiographer, therapeutic.  2013, Elsevier/Gold  Standard. (03/14/2008 3:09:00 PM)

## 2012-05-13 NOTE — Progress Notes (Signed)
Pulse: 81 1hr gtt: due at 1120

## 2012-05-13 NOTE — Progress Notes (Signed)
Pt c/o mild back pain.  Also c/o frequent urination.  +FM, No ctx, No LOF Will send urine cx. +leuk on urine dip.  Pt will wait for the cx before tx.    D/w PP contraception - interested in Nexplanon or Depo info given on each.  Pt to decide on next visit. 28 week labs today

## 2012-05-14 ENCOUNTER — Encounter: Payer: Self-pay | Admitting: Obstetrics & Gynecology

## 2012-05-14 LAB — HIV ANTIBODY (ROUTINE TESTING W REFLEX): HIV: NONREACTIVE

## 2012-05-14 LAB — GLUCOSE TOLERANCE, 1 HOUR (50G) W/O FASTING: Glucose, 1 Hour GTT: 102 mg/dL (ref 70–140)

## 2012-05-14 LAB — CULTURE, OB URINE: Organism ID, Bacteria: NO GROWTH

## 2012-05-18 ENCOUNTER — Inpatient Hospital Stay (HOSPITAL_COMMUNITY)
Admission: AD | Admit: 2012-05-18 | Discharge: 2012-05-18 | Disposition: A | Discharge status: Home / Self Care | Payer: Self-pay | Source: Ambulatory Visit | Attending: Family Medicine | Admitting: Family Medicine

## 2012-05-18 ENCOUNTER — Encounter (HOSPITAL_COMMUNITY): Payer: Self-pay | Admitting: *Deleted

## 2012-05-18 DIAGNOSIS — O26893 Other specified pregnancy related conditions, third trimester: Secondary | ICD-10-CM

## 2012-05-18 DIAGNOSIS — R1013 Epigastric pain: Secondary | ICD-10-CM | POA: Insufficient documentation

## 2012-05-18 DIAGNOSIS — K219 Gastro-esophageal reflux disease without esophagitis: Secondary | ICD-10-CM | POA: Insufficient documentation

## 2012-05-18 DIAGNOSIS — O99891 Other specified diseases and conditions complicating pregnancy: Secondary | ICD-10-CM | POA: Insufficient documentation

## 2012-05-18 DIAGNOSIS — O4700 False labor before 37 completed weeks of gestation, unspecified trimester: Secondary | ICD-10-CM | POA: Insufficient documentation

## 2012-05-18 LAB — WET PREP, GENITAL: Clue Cells Wet Prep HPF POC: NONE SEEN

## 2012-05-18 LAB — URINALYSIS, ROUTINE W REFLEX MICROSCOPIC
Ketones, ur: NEGATIVE mg/dL
Nitrite: NEGATIVE
Protein, ur: NEGATIVE mg/dL
Urobilinogen, UA: 0.2 mg/dL (ref 0.0–1.0)

## 2012-05-18 LAB — POCT FERN TEST: POCT Fern Test: NEGATIVE

## 2012-05-18 MED ORDER — GI COCKTAIL ~~LOC~~
30.0000 mL | Freq: Once | ORAL | Status: AC
  Administered 2012-05-18: 30 mL via ORAL
  Filled 2012-05-18: qty 30

## 2012-05-18 MED ORDER — PANTOPRAZOLE SODIUM 40 MG PO TBEC: 40.0000 mg | DELAYED_RELEASE_TABLET | Freq: Every day | ORAL | Status: DC

## 2012-05-18 NOTE — MAU Note (Signed)
Patient states she has been having upper abdominal pain that radiated down abdomen this afternoon. Hurts with fetal movement. Denies bleeding or vaginal discharge. Reports good fetal movement.

## 2012-05-18 NOTE — MAU Provider Note (Signed)
History     CSN: [id number]  Arrival date & time 05/18/12  1731   None     Chief Complaint  Patient presents with  . Abdominal Pain    HPI  Jacqueline Graves is a 27 y.o. [id number] at [redacted]w[redacted]d who presents compalining of pain in her epigastric area. The pain started today. It comes and goes now. She has had it before a little but not like this. Able to eat some. No vomiting.  She feels occasional cramping/contractions around the upper sides of her abdomen. Denies bleeding. Last felt the baby move a few minutes ago. Initially denies fluid leaking to me, then tells Dr. Thad Ranger that she feels some fluid coming out sometimes. Then endorses she feels it come out when the baby moves.   Denies fever or vaginal discharge. No problems during this pregnancy.   Past Medical History  Diagnosis Date  . Medical history non-contributory     History reviewed. No pertinent past surgical history.  History reviewed. No pertinent family history.  History  Substance Use Topics  . Smoking status: Never Smoker   . Smokeless tobacco: Never Used  . Alcohol Use: No    OB History   Grav Para Term Preterm Abortions TAB SAB Ect Mult Living   4 2 2  1  1   2     two kids vaginal deliveries - no problems during those pregnancies  Review of Systems pertinent pos and neg listed in HPI  Allergies  Review of patient's allergies indicates no known allergies.  Home Medications  Antacid - takes once a day in morning or afternoon - pharmacy got it from [facility] - can't remember name of it PNV  BP 123/75  Pulse 81  Temp(Src) 98.7 F (37.1 C) (Oral)  Resp 20  Ht 5\' 3"  (1.6 m)  Wt 156 lb 3.2 oz (70.852 kg)  BMI 27.68 kg/m2  SpO2 98%  LMP 11/03/2011  Physical Exam Gen: NAD Heart: RRR Lungs: CTAB, NWOB Abd: gravid. Tender to palpation in epigastric area. Otherwise soft, nontender to palpation. Ext: no appreciable lower extremity edema bilaterally Neuro: grossly nonfocal, speech intact GU:  normal appearing external genitalia. Speculum exam shows thick white discharge with some thinner discharge mixed in, present in the vagina. Cervix visually appears slightly opened. Dilation: Closed Effacement (%): 20 Cervical Position: [place]: -3 Exam by:: Lucy Chris RNC (cervix 1cm at external os, closed at internal os)  MAU Course  Procedures (including critical care time)  Labs Reviewed  WET PREP, GENITAL - Abnormal; Notable for the following:    WBC, Wet Prep HPF POC FEW (*)    All other components within normal limits  URINALYSIS, ROUTINE W REFLEX MICROSCOPIC - Abnormal; Notable for the following:    Leukocytes, UA TRACE (*)    All other components within normal limits  URINE MICROSCOPIC-ADD ON - Abnormal; Notable for the following:    Squamous Epithelial / LPF FEW (*)    All other components within normal limits  GC/CHLAMYDIA PROBE AMP  POCT FERN TEST   No results found.   1. Supervision of normal intrauterine pregnancy in multigravida, unspecified trimester    FHR: baseline 140s, moderate variability, + accels, no decels Toco: one short contraction  MDM  Jacqueline Graves is a 27 y.o. [id number] at [redacted]w[redacted]d who presents with epigastric pain. Does endorse hx of reflux. Most likely reflux in nature. Will give GI cocktail to see if it relieves her symptoms. Have called pt's pharmacy who report  that she had reglan filled there in February. This is likely the reflux medicine she is taking (could not recall the name).   Given report of fluid leaking, have checked fern slide which was negative. Will send gc/chlamydia and wet prep as well.  Care signed out to Dr. Thad Ranger at 8:00pm.  Levert Feinstein, MD [facility] Medicine PGY-1  I saw and examined patient and agree with above resident note. I reviewed history, imaging, labs, and vitals. I personally reviewed the fetal heart tracing, and it is reactive. Napoleon Form, MD

## 2012-05-19 LAB — GC/CHLAMYDIA PROBE AMP
CT Probe RNA: NEGATIVE
GC Probe RNA: NEGATIVE

## 2012-05-20 NOTE — MAU Provider Note (Signed)
Chart reviewed and agree with management and plan.  

## 2012-05-27 ENCOUNTER — Ambulatory Visit (INDEPENDENT_AMBULATORY_CARE_PROVIDER_SITE_OTHER): Payer: Self-pay | Admitting: Obstetrics and Gynecology

## 2012-05-27 VITALS — BP 112/71 | Temp 98.3°F | Wt 155.9 lb

## 2012-05-27 DIAGNOSIS — Z3480 Encounter for supervision of other normal pregnancy, unspecified trimester: Secondary | ICD-10-CM

## 2012-05-27 DIAGNOSIS — N949 Unspecified condition associated with female genital organs and menstrual cycle: Secondary | ICD-10-CM

## 2012-05-27 LAB — POCT URINALYSIS DIP (DEVICE)
Bilirubin Urine: NEGATIVE
Glucose, UA: NEGATIVE mg/dL
Nitrite: NEGATIVE
pH: 6 (ref 5.0–8.0)

## 2012-05-27 NOTE — Progress Notes (Signed)
Pulse- 84 Patient reports some lower abdominal/pelvic pressure

## 2012-05-27 NOTE — Progress Notes (Signed)
Anemia resolved. Denies UCs. Pelvic pressure is when baby moves and when she walks. RLP discussed. Plans breastfeed and nexplanon.

## 2012-05-27 NOTE — Patient Instructions (Signed)
Dolor del ligamento redondo  (Round Ligament Pain)  El ligamento redondo se compone de músculo y tejido fibroso. Está unido al útero cerca de las trompas de [place] El ligamento redondo está ubicado en ambos lados del útero y ayuda a mantener su posición. Normalmente comienza en el segundo trimestre del embarazo cuando el útero sale hacia afuera de la pelvis. El dolor puede aparecer y desaparecer hasta el nacimiento del bebé. El dolor de ligamento redondo no es un problema serio y no ocasiona daños al bebé.  CAUSAS  Durante el embarazo el útero crece mayormente desde el segundo trimestre hasta el parto. A medida que crece, se estira y tuerce ligeramente los ligamentos. Cuando el útero ejerce presión en ambos lados, el ligamento redondo del lado opuesto presiona y se estira. Esto causa dolor.  SÍNTOMAS  El dolor puede ocurrir en uno o ambos lados. El dolor es por lo general como un pellizco corto y filoso. A veces puede ser un dolor opaco y persistente. Se siente en la parte baja del abdomen o en la ingle. Es un dolor interno, y por lo general comienza en la ingle y se mueve hacia la zona de la cadera. El dolor puede ocurrir con:  · Cambio de posición repentino como el levantarse de la cama o de una silla.  · Darse vuelta en la cama.  · Toser o estornudar.  · Caminar demasiado.  · Cualquier tipo de actividad física.  DIAGNÓSTICO  El medico deberá asegurarse de que no existen problemas graves que ocasionan el dolor. Si no encuentra nada grave, los síntomas suelen indicar de que se trata de un dolor proveniente del ligamento redondo.  TRATAMIENTO  · Siéntese y relájese cuando el dolor comience.  · Llevar las rodillas hacia el estómago.  · Recuéstese sobre un lado con una almohada debajo del vientre (abdomen) y otra entre sus piernas.  · Siéntese en un baño caliente de 15 a 20 minutos o hasta que el dolor desaparezca.  INSTRUCCIONES PARA EL CUIDADO DOMICILIARIO  · Utilice los medicamentos de venta libre o de  prescripción para el dolor, el malestar o la fiebre, según se lo indique el profesional que lo asiste.  · Siéntese y póngase de pie lentamente.  · Evite caminatas largas si le ocasionan dolor.  · Detenga o disminuya las actividades físicas si le ocasionan dolor.  SOLICITE ATENCIÓN MÉDICA SI:  · El dolor no desaparece con estas medidas.  · Necesita que le prescriban medicamentos más fuertes para el dolor.  · Desarrolla un dolor de espalda que no había sentido antes junto con el de lado.  SOLICITE ATENCIÓN MÉDICA DE INMEDIATO SI:  · La temperatura se eleva por encima de 102° F (38.9° C) o más.  · Siente contracciones uterinas.  · Presenta hemorragia vaginal.  · Presenta náuseas, diarrea o vómitos.  · Comienza a sentir escalofríos  · Siente dolor al orinar.  Document Released: 12/14/2007 Document Revised: 03/25/2011  [employer]® Patient Information ©2013 [employer].

## 2012-06-10 ENCOUNTER — Ambulatory Visit (INDEPENDENT_AMBULATORY_CARE_PROVIDER_SITE_OTHER): Payer: Self-pay | Admitting: Obstetrics and Gynecology

## 2012-06-10 VITALS — BP 123/73 | Temp 98.3°F | Wt 157.0 lb

## 2012-06-10 DIAGNOSIS — O26893 Other specified pregnancy related conditions, third trimester: Secondary | ICD-10-CM

## 2012-06-10 DIAGNOSIS — O9989 Other specified diseases and conditions complicating pregnancy, childbirth and the puerperium: Secondary | ICD-10-CM

## 2012-06-10 LAB — POCT URINALYSIS DIP (DEVICE)
Bilirubin Urine: NEGATIVE
Ketones, ur: NEGATIVE mg/dL
Leukocytes, UA: NEGATIVE
Protein, ur: NEGATIVE mg/dL

## 2012-06-10 MED ORDER — OXYCODONE-ACETAMINOPHEN 5-325 MG PO TABS: 1.0000 | ORAL_TABLET | ORAL | Status: DC | PRN

## 2012-06-10 NOTE — Progress Notes (Signed)
Pulse- 90 Patient reports headaches, dizziness, and blurry vision for the past month

## 2012-06-10 NOTE — Progress Notes (Signed)
Frontal H/A x 2 days, Tylenol 1000 mg not helping. Denies increased stress or allergies, URI sx. Similar H/As when not pregnant but not as bad>Rx Percocet #5 for breakthrough pain. Dizziness at times> discussed. Last hgb 11.1> add high iron foods, frequent meals.

## 2012-07-01 ENCOUNTER — Ambulatory Visit (INDEPENDENT_AMBULATORY_CARE_PROVIDER_SITE_OTHER): Payer: Self-pay | Admitting: Obstetrics and Gynecology

## 2012-07-01 VITALS — BP 114/73 | Wt 160.2 lb

## 2012-07-01 DIAGNOSIS — Z3493 Encounter for supervision of normal pregnancy, unspecified, third trimester: Secondary | ICD-10-CM

## 2012-07-01 DIAGNOSIS — Z3480 Encounter for supervision of other normal pregnancy, unspecified trimester: Secondary | ICD-10-CM

## 2012-07-01 LAB — POCT URINALYSIS DIP (DEVICE)
Glucose, UA: NEGATIVE mg/dL
Nitrite: NEGATIVE
Specific Gravity, Urine: 1.025 (ref 1.005–1.030)
Urobilinogen, UA: 1 mg/dL (ref 0.0–1.0)

## 2012-07-01 NOTE — Progress Notes (Signed)
Pulse: 86

## 2012-07-01 NOTE — Patient Instructions (Signed)
Embarazo  Tercer trimestre  (Pregnancy - Third Trimester) El tercer trimestre del embarazo (los ltimos 3 meses) es el perodo en el cual tanto usted como su beb crecen con ms rapidez. El beb alcanza un largo de aproximadamente 50 cm. y pesa entre 2,700 y 4,500 kg. El beb gana ms tejido graso y est listo para la vida fuera del cuerpo de la madre. Mientras estn en el interior, los bebs tienen perodos de sueo y vigilia, succionan el pulgar y tienen hipo. Quizs sienta pequeas contracciones del tero. Este es el falso trabajo de parto. Tambin se las conoce como contracciones de Braxton-Hicks . Es como una prctica del parto. Los problemas ms habituales de esta etapa del embarazo incluyen mayor dificultad para respirar, hinchazn de las manos y los pies por retencin de lquidos y la necesidad de orinar con ms frecuencia debido a que el tero y el beb presionan sobre la vejiga.  EXAMENES PRENATALES   Durante los exmenes prenatales, deber seguir realizndose anlisis de sangre. Estas pruebas se realizan para controlar su salud y la del beb. Los anlisis de sangre se realizan para conocer los niveles de algunos compuestos de la sangre (hemoglobina). La anemia (bajo nivel de hemoglobina) es frecuente durante el embarazo. Para prevenirla, se administran hierro y vitaminas. Tambin le tomarn nuevas anlisis para descartar diabetes. Podrn repetirle algunas de las pruebas que le hicieron previamente.  En cada visita le medirn el tamao del tero. Esto permite asegurar que el beb se desarrolla adecuadamente, segn la fecha del embarazo.  Le controlarn la presin arterial en cada visita prenatal. Esto es para asegurarse de que no sufre toxemia.  Le harn un anlisis de orina en cada visita prenatal, para descartar infecciones, diabetes y la presencia de protenas.  Tambin en cada visita controlarn su peso. Esto se realiza para asegurarse que aumenta de peso al ritmo indicado y que usted y su  beb evolucionan normalmente.  En algunas ocasiones se realiza una prueba de ultrasonido para confirmar el correcto desarrollo y evolucin del beb. Esta prueba se realiza con ondas sonoras inofensivas para el beb, de modo que el profesional pueda calcular ms precisamente la fecha del parto.  Analice con su mdico los analgsicos y la anestesia que recibir durante el trabajo de parto y el parto.  Comente la posibilidad de que necesite una cesrea y qu anestesia se recibir.  Informe a su mdico si sufre violencia familiar mental o fsica. A veces, se indica la prueba especializada sin estrs, la prueba de tolerancia a las contracciones y el perfil biofsico para asegurarse de que el beb no tiene problemas. El estudio del lquido amnitico que rodea al beb se llama amniocentesis. El lquido amnitico se obtiene introduciendo una aguja en el vientre (abdomen ). En ocasiones se lleva a cabo cerca del final del embarazo, si es necesario inducir a un parto. En este caso se realiza para asegurarse que los pulmones del beb estn lo suficientemente maduros como para que pueda vivir fuera del tero. Si los pulmones no han madurado y es peligroso que el beb nazca, se administrar a la madre una inyeccin de cortisona , 1 a 2 das antes del parto. . Esto ayuda a que los pulmones del beb maduren y sea ms seguro su nacimiento.  CAMBIOS QUE OCURREN EN EL TERCER TRIMESTRE DEL EMBARAZO  Su organismo atravesar numerosos cambios durante el embarazo. Estos pueden variar de una persona a otra. Converse con el profesional que la asiste acerca los cambios que   usted note y que la preocupen.   Durante el ltimo trimestre probablemente sienta un aumento del apetito. Es normal tener "antojos" de ciertas comidas. Esto vara de una persona a otra y de un embarazo a otro.  Podrn aparecer las primeras estras en las caderas, abdomen y mamas. Estos son cambios normales del cuerpo durante el embarazo. No existen  medicamentos ni ejercicios que puedan prevenir estos cambios.  La constipacin puede tratarse con un laxante o agregando fibra a su dieta. Beber grandes cantidades de lquidos, tomar fibras en forma de vegetales, frutas y granos integrales es de gran ayuda.  Tambin es beneficioso practicar actividad fsica. Si ha sido una persona activa hasta el embarazo, podr continuar con la mayora de las actividades durante el mismo. Si ha sido menos activa, puede ser beneficioso que comience con un programa de ejercicios, como realizar caminatas. Consulte con el profesional que la asiste antes de comenzar un programa de ejercicios.  Evite el consumo de cigarrillos, el alcohol, los medicamentos no recetados y las "drogas de la calle" durante el embarazo. Estas sustancias qumicas afectan la formacin y el desarrollo del beb. Evite estas sustancias durante todo el embarazo para asegurar el nacimiento de un beb sano.  Podr sentir dolor de espalda, tener vrices en las venas y hemorroides, o si ya los sufra, pueden empeorar.  Durante el tercer trimestre se cansar con ms facilidad, lo cual es normal.  Los movimientos del beb pueden ser ms fuertes y con ms frecuencia.  Puede que note dificultades para respirar normalmente.  El ombligo puede salir hacia afuera.  A veces sale una secrecin amarilla de las mamas, que se llama calostro.  Podr aparecer una secrecin mucosa con sangre. Esto suele ocurrir entre unos pocos das y una semana antes del parto. INSTRUCCIONES PARA EL CUIDADO EN EL HOGAR   Cumpla con las citas de control. Siga las indicaciones del mdico con respecto al uso de medicamentos, los ejercicios y la dieta.  Durante el embarazo debe obtener nutrientes para usted y para su beb. Consuma alimentos balanceados a intervalos regulares. Elija alimentos como carne, pescado, leche y otros productos lcteos descremados, vegetales, frutas, panes integrales y cereales. El mdico le informar  cul es el aumento de peso ideal.  Las relaciones sexuales pueden continuarse hasta casi el final del embarazo, si no se presentan otros problemas como prdida prematura (antes de tiempo) de lquido amnitico, hemorragia vaginal o dolor en el vientre (abdominal).  Realice actividad fsica todos los das, si no tiene restricciones. Consulte con el profesional que la asiste si no sabe con certeza si determinados ejercicios son seguros. El mayor aumento de peso se producir en los ltimos 2 trimestres del embarazo. El ejercicio ayuda a:  Controlar su peso.  Mantenerse en forma para el trabajo de parto y el parto .  Perder peso despus del parto.  Haga reposo con frecuencia, con las piernas elevadas, o segn lo necesite para evitar los calambres y el dolor de cintura.  Use un buen sostn o como los que se usan para hacer deportes para aliviar la sensibilidad de las mamas. Tambin puede serle til si lo usa mientras duerme. Si pierde calostro, podr utilizar apsitos en el sostn.  No utilice la baera con agua caliente, baos turcos y saunas.   Colquese el cinturn de seguridad cuando conduzca. Este la proteger a usted y al beb en caso de accidente.  Evite comer carne cruda y el contacto con los utensilios y desperdicios de los gatos. Estos   elementos contienen grmenes que pueden causar defectos de nacimiento en el beb.  Es fcil perder algo de orina durante el embarazo. Apretar y fortalecer los msculos de la pelvis la ayudar con este problema. Practique detener la miccin cuando est en el bao. Estos son los mismos msculos que necesita fortalecer. Son tambin los mismos msculos que utiliza cuando trata de evitar despedir gases. Puede practicar apretando estos msculos diez veces, y repetir esto tres veces por da aproximadamente. Una vez que conozca qu msculos debe apretar, no realice estos ejercicios durante la miccin. Puede favorecerle una infeccin si la orina vuelve hacia  atrs.  Pida ayuda si tienen necesidades financieras, teraputicas o nutricionales. El profesional podr ayudarla con respecto a estas necesidades, o derivarla a otros especialistas.  Haga una lista de nmeros telefnicos de emergencia y tngalos disponibles.  Planifique como obtener ayuda de familiares o amigos cuando regrese a casa desde el hospital.  Hacer un ensayo sobre la partida al hospital.  Tome clases prenatales con el padre para entender, practicar y hacer preguntas sobre el trabajo de parto y el alumbramiento.  Preparar la habitacin del beb / busque una guardera.  No viaje fuera de la ciudad a menos que sea absolutamente necesario y con el asesoramiento de su mdico.  Use slo zapatos de tacn bajo o sin tacn para tener mejor equilibrio y evitar cadas. USO DE MEDICAMENTOS Y CONSUMO DE DROGAS DURANTE EL EMBARAZO   Tome las vitaminas apropiadas para esta etapa tal como se le indic. Las vitaminas deben contener un miligramo de cido flico. Guarde todas las vitaminas fuera del alcance de los nios. La ingestin de slo un par de vitaminas o tabletas que contengan hierro pueden ocasionar la muerte en un beb o en un nio pequeo.  Evite el uso de todos los medicamentos, incluyendo hierbas, medicamentos de venta libre, sin receta o que no hayan sido sugeridos por su mdico. Slo tome medicamentos de venta libre o medicamentos recetados para el dolor, el malestar o fiebre como lo indique su mdico. No tome aspirina, ibuprofeno o naproxeno excepto que su mdico se lo indique.  Infrmele al profesional si consume alguna droga.  El alcohol se relaciona con ciertos defectos congnitos. Incluye el sndrome de alcoholismo fetal. Debe evitar absolutamente el consumo de alcohol, en cualquier forma. El fumar produce baja tasa de natalidad y bebs prematuros.  Las drogas ilegales o de la calle son muy perjudiciales para el beb. Estn absolutamente prohibidas. Un beb que nace de una  madre adicta, ser adicto al nacer. Ese beb tendr los mismos sntomas de abstinencia que un adulto. SOLICITE ATENCIN MDICA SI:  Tiene preguntas o preocupaciones relacionadas con el embarazo. Es mejor que llame para formular las preguntas si no puede esperar hasta la prxima visita, que sentirse preocupada por ellas.  SOLICITE ATENCIN MDICA DE INMEDIATO SI:   La temperatura oral le sube a ms de 38,9 C (102 F) o lo que su mdico le indique.  Tiene una prdida de lquido por la vagina (canal de parto). Si sospecha una ruptura de las membranas, tmese la temperatura y llame al profesional para informarlo sobre esto.  Observa unas pequeas manchas, una hemorragia vaginal o elimina cogulos. Notifique al profesional acerca de la cantidad y de cuntos apsitos est utilizando.  Presenta un olor desagradable en la secrecin vaginal y observa un cambio en el color, de transparente a blanco.  Ha vomitado durante ms de 24 horas.  Siente escalofros o le sube la fiebre.  Le   falta el aire.  Siente ardor al orinar.  Baja o sube ms de 2 libras (900 g), o segn lo indicado por el profesional que la asiste.  Observa que sbitamente se le hinchan el rostro, las manos, los pies o las piernas.  Siente dolor en el vientre (abdominal). Las molestias en el ligamento redondo son una causa benigna frecuente de dolor abdominal durante el embarazo. El profesional que la asiste deber evaluarla.  Presenta dolor de cabeza intenso que no se alivia.  Tiene problemas visuales, visin doble o borrosa.  Si no siente los movimientos del beb durante ms de 1 hora. Si piensa que el beb no se mueve tanto como lo haca habitualmente, coma algo que contenga azcar y recustese sobre el lado izquierdo durante una hora. El beb debe moverse al menos 4  5 veces por hora. Comunquese inmediatamente si el beb se mueve menos que lo indicado.  Se cae, se ve involucrada en un accidente automovilstico o sufre algn  tipo de traumatismo.  En su hogar hay violencia mental o fsica. Document Released: 10/10/2004 Document Revised: 09/25/2011 [employer] Patient Information 2014 [employer].  

## 2012-07-01 NOTE — Progress Notes (Signed)
No H/A, no dizziness. Plans: breastfeed, Nexplanon.

## 2012-07-15 ENCOUNTER — Ambulatory Visit (INDEPENDENT_AMBULATORY_CARE_PROVIDER_SITE_OTHER): Payer: Self-pay | Admitting: Advanced Practice Midwife

## 2012-07-15 ENCOUNTER — Ambulatory Visit (HOSPITAL_COMMUNITY)
Admission: RE | Admit: 2012-07-15 | Discharge: 2012-07-15 | Disposition: A | Discharge status: Home / Self Care | Payer: Self-pay | Source: Ambulatory Visit | Attending: Advanced Practice Midwife | Admitting: Advanced Practice Midwife

## 2012-07-15 VITALS — BP 114/76 | Wt 160.2 lb

## 2012-07-15 DIAGNOSIS — Z3483 Encounter for supervision of other normal pregnancy, third trimester: Secondary | ICD-10-CM

## 2012-07-15 DIAGNOSIS — O365990 Maternal care for other known or suspected poor fetal growth, unspecified trimester, not applicable or unspecified: Secondary | ICD-10-CM | POA: Insufficient documentation

## 2012-07-15 DIAGNOSIS — Z3689 Encounter for other specified antenatal screening: Secondary | ICD-10-CM | POA: Insufficient documentation

## 2012-07-15 DIAGNOSIS — Z3480 Encounter for supervision of other normal pregnancy, unspecified trimester: Secondary | ICD-10-CM

## 2012-07-15 LAB — POCT URINALYSIS DIP (DEVICE)
Bilirubin Urine: NEGATIVE
Glucose, UA: NEGATIVE mg/dL
Nitrite: NEGATIVE

## 2012-07-15 NOTE — Progress Notes (Signed)
Patient ID: Jacqueline Graves, female   DOB: 03-02-1985, 27 y.o.   MRN: [id number] U/S today - EFW 45%ile, AFI normal, cephalic lie

## 2012-07-15 NOTE — Progress Notes (Signed)
Pulse: 88

## 2012-07-15 NOTE — Progress Notes (Signed)
Well, no c/o, rev'd FKC and labor precautions. GBS and GC today. Size less than dates, u/s ordered.

## 2012-07-15 NOTE — Patient Instructions (Signed)
Embarazo  Systems analyst trimestre  (Pregnancy - Third Trimester) El tercer trimestre del Psychiatrist (los ltimos 3 meses) es el perodo en el cual tanto usted como su beb crecen con ms rapidez. El beb alcanza un largo de aproximadamente 50 cm. y pesa entre 2,700 y 4,500 kg. El beb gana ms tejido graso y est listo para la vida fuera del cuerpo de la [place]. Mientras estn en el interior, los bebs tienen perodos de sueo y vigilia, Warehouse manager y tienen hipo. Quizs sienta pequeas contracciones del tero. Este es el falso trabajo de [place]. Tambin se las conoce como contracciones de [place] . Es como una prctica del parto. Los problemas ms habituales de esta etapa del embarazo incluyen mayor dificultad para respirar, hinchazn de las manos y los pies por retencin de lquidos y la necesidad de Geographical information systems officer con ms frecuencia debido a que el tero y el beb presionan sobre la vejiga.  EXAMENES PRENATALES   Durante los [employer], deber seguir realizndose anlisis de [place]. Estas pruebas se realizan para controlar su salud y la del beb. Los [employer] de sangre se Radiographer, therapeutic para [employer] niveles de algunos compuestos de la sangre (hemoglobina). La anemia (bajo nivel de hemoglobina) es frecuente durante el embarazo. Para prevenirla, se administran hierro y vitaminas. Tambin le tomarn nuevas anlisis para descartar diabetes. Podrn repetirle algunas de las [employer] hicieron previamente.  En cada visita le medirn el tamao del tero. Esto permite asegurar que el beb se desarrolla adecuadamente, segn la fecha del embarazo.  Le controlarn la presin arterial en cada visita prenatal. Esto es para asegurarse de que no sufre toxemia.  Le harn un anlisis de orina en cada visita prenatal, para descartar infecciones, diabetes y la presencia de protenas.  Tambin en cada visita controlarn su peso. Esto se realiza para asegurarse que aumenta de peso al ritmo indicado y que usted y su  beb evolucionan normalmente.  En algunas ocasiones se realiza una prueba de ultrasonido para confirmar el correcto desarrollo y evolucin del beb. Esta prueba se realiza con ondas sonoras inofensivas para el beb, de modo que el profesional pueda calcular ms precisamente la fecha del [place].  Analice con su mdico los analgsicos y la anestesia que recibir durante el [place] de parto y [place].  Comente la posibilidad de que necesite una cesrea y qu anestesia se recibir.  Informe a su mdico si sufre violencia familiar mental o fsica. A veces, se indica la prueba especializada sin estrs, la prueba de tolerancia a las contracciones y el perfil biofsico para asegurarse de que el beb no tiene problemas. El estudio del lquido amnitico que rodea al beb se llama amniocentesis. El lquido amnitico se obtiene introduciendo una aguja en el vientre (abdomen ). En ocasiones se lleva a cabo cerca del final del embarazo, si es necesario inducir a un parto. En este caso se realiza para asegurarse que los pulmones del beb estn lo suficientemente maduros como para que pueda vivir fuera del tero. Si los pulmones no han madurado y es peligroso que el beb nazca, se Building services engineer a la madre una inyeccin de [place] , 1 a 2 [address]  [address] antes del [address]. Vivia Budge ayuda a que los pulmones del beb maduren y sea ms seguro su nacimiento.  CAMBIOS QUE OCURREN EN EL TERCER TRIMESTRE DEL EMBARAZO  Su organismo atravesar numerosos cambios durante el [place]. Estos pueden variar de Neomia Dear persona a otra. Converse con el profesional que la asiste acerca los cambios que  usted note y que la preocupen.   Durante el ltimo trimestre probablemente sienta un aumento del apetito. Es normal tener "antojos" de Development worker, community. Esto vara de Neomia Dear persona a otra y de un embarazo a Therapist, art.  Podrn aparecer las primeras estras en las caderas, abdomen y [place]. Estos son cambios normales del cuerpo durante el [place]. No existen  medicamentos ni ejercicios que puedan prevenir [employer].  La constipacin puede tratarse con un laxante o agregando fibra a su dieta. Beber grandes cantidades de lquidos, tomar fibras en forma de vegetales, frutas y granos integrales es de gran [place].  Tambin es beneficioso practicar actividad fsica. Si ha sido una persona Engineer, mining, podr continuar con la [employer] de las actividades durante el mismo. Si ha sido [employer], puede ser beneficioso que comience con un programa de ejercicios, Museum/gallery exhibitions officer. Consulte con el profesional que la asiste antes de comenzar un programa de ejercicios.  Evite el consumo de cigarrillos, el alcohol, los medicamentos no recetados y las "drogas de la calle" durante el Psychiatrist. Estas sustancias qumicas afectan la formacin y el desarrollo del beb. Evite estas sustancias durante todo el embarazo para asegurar el nacimiento de un beb sano.  Podr sentir dolor de espalda, tener vrices en las venas y hemorroides, o si ya los sufra, pueden [place].  Durante el tercer trimestre se cansar con ms facilidad, lo cual es normal.  Los movimientos del beb pueden ser ms fuertes y con ms frecuencia.  Puede que note dificultades para respirar normalmente.  El ombligo puede salir hacia afuera.  A veces sale Veterinary surgeon de las [place], que se llama Product manager.  Podr aparecer Neomia Dear secrecin mucosa con sangre. Esto suele ocurrir [employer] unos [address] y Neomia Dear semana antes del [place]. INSTRUCCIONES PARA EL CUIDADO EN EL HOGAR   Cumpla con las citas de control. Siga las indicaciones del mdico con respecto al uso de [place], los ejercicios y la dieta.  Durante el embarazo debe obtener nutrientes para usted y para su beb. Consuma alimentos balanceados a intervalos regulares. Elija alimentos como carne, pescado, [place] y otros productos lcteos descremados, vegetales, frutas, panes integrales y cereales. El [employer] informar  cul es el aumento de peso ideal.  Las relaciones sexuales pueden continuarse hasta casi el final del embarazo, si no se presentan otros problemas como prdida prematura (antes de [place]) de lquido amnitico, hemorragia vaginal o dolor en el vientre (abdominal).  Realice [employer], si no tiene restricciones. Consulte con el profesional que la asiste si no sabe con certeza si determinados ejercicios son seguros. El mayor aumento de peso se producir en los ltimos 2 trimestres del Psychiatrist. El ejercicio ayuda a:  Engineering geologist.  Mantenerse en forma para el trabajo de parto y [place] .  Perder peso despus del parto.  Haga reposo con frecuencia, con las piernas elevadas, o segn lo necesite para evitar los calambres y el dolor de cintura.  Use un buen sostn o como los que se usan para hacer deportes para Paramedic la sensibilidad de las [place]. Tambin puede serle til si lo [place] mientras duerme. Si pierde Product manager, podr [employer].  No utilice la baera con agua caliente, baos turcos y saunas.   Colquese el cinturn de seguridad cuando conduzca. Este la proteger a usted y al beb en caso de accidente.  Evite comer carne cruda y el contacto con los utensilios y desperdicios de los gatos. Estos  elementos contienen grmenes que pueden causar defectos de nacimiento en el beb.  Es fcil perder algo de orina durante el [place]. Apretar y Chief Operating Officer los msculos de la pelvis la ayudar con este problema. Practique detener la miccin cuando est en el bao. Estos son los mismos msculos que Development worker, international aid. Son [employer] mismos msculos que utiliza cuando trata de evitar despedir gases. Puede practicar apretando estos msculos [employer], y repetir esto tres veces por da aproximadamente. Una vez que conozca qu msculos debe apretar, no realice estos ejercicios durante la miccin. Puede favorecerle una infeccin si la orina vuelve hacia  atrs.  Pida ayuda si tienen necesidades financieras, teraputicas o nutricionales. El profesional podr ayudarla con respecto a estas necesidades, o derivarla a otros especialistas.  Haga una lista de nmeros telefnicos de emergencia y tngalos disponibles.  Planifique como obtener ayuda de familiares o amigos cuando regrese a Programmer, applications hospital.  Hacer un ensayo sobre la partida al hospital.  [place] clases prenatales con el padre para entender, practicar y hacer preguntas sobre el [place] de parto y el alumbramiento.  Preparar la habitacin del beb / busque Fatima Blank.  No viaje fuera de la ciudad a menos que sea absolutamente necesario y con el asesoramiento de su mdico.  Use slo zapatos de tacn bajo o sin tacn para tener mejor equilibrio y Automotive engineer cadas. USO DE MEDICAMENTOS Y CONSUMO DE DROGAS DURANTE EL [facility]   Tome las vitaminas apropiadas para esta etapa tal como se le indic. Las vitaminas deben contener un miligramo de cido flico. Guarde todas las vitaminas fuera del alcance de los nios. La ingestin de slo un par de vitaminas o tabletas que contengan hierro pueden ocasionar la [employer] en un beb o en un nio pequeo.  Evite el uso de [employer] Omaha, incluyendo hierbas, medicamentos de [place], sin receta o que no hayan sido sugeridos por su mdico. Slo tome medicamentos de venta libre o medicamentos recetados para Chief Technology Officer, Environmental health practitioner o fiebre como lo indique su mdico. No tome aspirina, ibuprofeno o naproxeno excepto que su mdico se lo indique.  Infrmele al profesional si consume alguna droga.  El alcohol se relaciona con ciertos defectos congnitos. Incluye el sndrome de alcoholismo fetal. Debe evitar absolutamente el consumo de alcohol, en cualquier forma. El fumar produce baja tasa de natalidad y bebs prematuros.  Las drogas ilegales o de la calle son muy perjudiciales para el beb. Estn absolutamente prohibidas. Un beb que nace de [employer], ser adicto al nacer. Ese beb tendr los mismos sntomas de abstinencia que un adulto. SOLICITE ATENCIN MDICA SI:  Tiene preguntas o preocupaciones relacionadas con el embarazo. Es mejor que llame para formular las preguntas si no puede esperar hasta la prxima visita, que sentirse preocupada por ellas.  SOLICITE ATENCIN MDICA DE INMEDIATO SI:   La temperatura oral le sube a ms de 38,9 C (102 F) o lo que su mdico le indique.  Tiene una prdida de lquido por la vagina (canal de parto). Si sospecha una ruptura de las [place], tmese la temperatura y llame al profesional para informarlo sobre esto.  Observa unas pequeas manchas, una hemorragia vaginal o elimina cogulos. Notifique al profesional acerca de la cantidad y de cuntos apsitos est utilizando.  Presenta un olor desagradable en la secrecin vaginal y observa un cambio en el color, de transparente a blanco.  Ha vomitado durante ms de 24 horas.  Siente escalofros o le sube la fiebre.  Conley Rolls  falta el aire.  Siente ardor al Beatrix Shipper.  Baja o sube ms de 2 libras (900 g), o segn lo indicado por el profesional que la asiste.  Observa que sbitamente se le hinchan el rostro, las manos, los pies o las piernas.  Siente dolor en el vientre (abdominal). Las [employer] en el ligamento redondo son Neomia Dear causa benigna frecuente de dolor abdominal durante el embarazo. El profesional que la asiste deber evaluarla.  Presenta dolor de cabeza intenso que no se [place].  Tiene problemas visuales, visin doble o borrosa.  Si no siente los movimientos del beb durante ms de 1 hora. Si piensa que el beb no se mueve tanto como lo haca habitualmente, coma algo que Psychologist, clinical y [employer] lado izquierdo durante [place]. El beb debe moverse al menos 4  5 veces por hora. Comunquese inmediatamente si el beb se mueve menos que lo indicado.  Se cae, se ve involucrada en un accidente automovilstico o sufre algn  tipo de traumatismo.  En su hogar hay violencia mental o fsica. Document Released: 10/10/2004 Document Revised: 09/25/2011 [facility] Patient Information 2014 [place], [place].

## 2012-07-16 LAB — GC/CHLAMYDIA PROBE AMP
CT Probe RNA: NEGATIVE
GC Probe RNA: NEGATIVE

## 2012-07-18 LAB — CULTURE, BETA STREP (GROUP B ONLY)

## 2012-07-22 ENCOUNTER — Ambulatory Visit (INDEPENDENT_AMBULATORY_CARE_PROVIDER_SITE_OTHER): Payer: Self-pay | Admitting: Obstetrics and Gynecology

## 2012-07-22 VITALS — BP 120/77 | Temp 97.7°F | Wt 160.4 lb

## 2012-07-22 DIAGNOSIS — Z3480 Encounter for supervision of other normal pregnancy, unspecified trimester: Secondary | ICD-10-CM

## 2012-07-22 LAB — POCT URINALYSIS DIP (DEVICE)
Protein, ur: NEGATIVE mg/dL
Urobilinogen, UA: 1 mg/dL (ref 0.0–1.0)

## 2012-07-22 NOTE — Progress Notes (Signed)
Pulse- 83  Pain/pressure-lower abd  "feels like my back is opening" Vaginal discharge- white, yellow

## 2012-07-22 NOTE — Progress Notes (Signed)
Doing well. [place]: 45th, all nl. Reviewed plans and LBP measures. GBS neg.

## 2012-07-22 NOTE — Patient Instructions (Signed)
Dolor de espalda durante el embarazo  °(Back Pain in Pregnancy) ° El dolor de espalda es habitual durante el embarazo. Ocurre en aproximadamente la mitad de todos los embarazos. Es importante para usted y su bebé que permanezca activa durante el embarazo. Si siente que el dolor de espalda es lo que no le permite mantenerse activa o dormir bien, debe consultar a su médico. La causa del dolor de espalda puede deberse a varios factores relacionados con los cambios durante el embarazo. Afortunadamente, excepto que haya tenido problemas de espalda antes del embarazo, es probable que el dolor mejore después del parto. °El dolor lumbar por lo general ocurre entre el quinto y séptimo mes del embarazo. Sin embargo, puede ocurrir en los dos primeros meses. Otros factores que aumentan el riesgo son:  °· Problemas previos en la espalda. °· Lesiones en la espalda. °· Tener gemelos o embarazos múltiples. °· Tos persistente. °· El estrés. °· Movimientos repetitivos relacionados con el trabajo. °· Enfermedad muscular o de la columna vertebral en la espalda. °· Antecedentes familiares de problemas de espalda, rotura (hernia) de discos u osteoporosis. °· Depresión, ansiedad y crisis de angustia. °CAUSAS  °· En las embarazadas, el cuerpo produce una hormona llamada relaxina. Esta hormona hace que los ligamentos que conectan la zona lumbar y los huesos del pubis sean más flexibles. Esta flexibilidad permite que el bebé nazca con más facilidad. Cuando los ligamentos están relajados, los músculos tienen que trabajar más para apoyar la espalda. El dolor en la espalda puede deberse al cansancio muscular. El dolor también puede tener su causa en la irritación de los tejidos de a espalda que se irritan ya que están recibiendo menos apoyo. °· A medida que el bebé crece, ejerce presión sobre los nervios y los vasos sanguíneos de la pelvis. Esto causa dolor de espalda. °· A medida que el bebé crece y aumenta el peso durante el embarazo, el  útero presiona los músculos del estómago hacia adelante y cambia su centro de gravedad. Esto hace que los músculos de la espalda deban trabajar más para mantener una buena postura. °SÍNTOMAS  °Dolor lumbar durante el embarazo °Generalmente se produce en la zona o por arriba de la cintura en el centro de la espalda. Puede haber dolor y entumecimiento que se irradia hacia la pierna o el pie. Es similar al dolor de espalda baja experimentada por las mujeres no embarazadas. Por lo general, aumenta al permanecer de pie o sentada por largos períodos de tiempo, o con levantamientos repetitivos También puede haber sensibilidad en los músculos en la zona superior de la espalda .  °Dolor pélvico posterior durante el embarazo °Dolor en la parte posterior de la pelvis es más frecuente que el dolor lumbar en el embarazo. Se trata de un dolor profundo que se siente a un lado en la cintura, o a través del cóxis (sacro), o en ambos lugares. Puede sentir dolor en uno o ambos lados Este dolor también puede sentirse en las nalgas y el dorso de los muslos También puede haber dolor púbico y en la ingle. El dolor no se mejora rápidamente con el reposo, y también puede haber rigidez matutina. °Muchas actividades pueden causarlo. Un buen estado físico antes y durante el embarazo puede o no prevenir este problema. Las contracciones del parto suelen aparecer cada 1 a 2 minutos, tienen una duración de aproximadamente 1 minuto, e implica una sensación de empujar o presión en la pelvis. Sin embargo, si usted está a término con el embarazo, el dolor constante   en la zona lumbar puede indicar el comienzo de un parto prematuro, y usted debe ser consciente de ello.  °DIAGNÓSTICO  °No se deben tomar radiografías de la espalda durante las primeras 12 a 14 semanas del embarazo y durante el resto del embarazo, sólo cuando sea absolutamente necesario. La resonancia magnética no emite radiación y es un estudio seguro durante el embarazo. Pero también se  deben hacer solamente cuando sea absolutamente necesario.  °INSTRUCCIONES PARA EL CUIDADO EN EL HOGAR  °· Realice actividad física según las indicaciones del médico. El ejercicio es la manera más eficaz para prevenir o tratar el dolor de espalda. Si tiene un problema en la espalda, es especialmente importante evitar los deportes que requieran de movimientos corporales rápidos. La natación y las caminatas son las mejores actividades. °· No permanezca sentada o de pie en el mismo lugar durante largos períodos. °· No use tacos altos. °· Siéntese en la silla con una buena postura. Use una almohada en su espalda baja si es necesario. Asegúrese de que su cabeza descansa sobre sus hombros y no está colgando hacia delante. °· Trate de dormir de lado, de preferencia el lado izquierdo, con una o dos almohadas entre las piernas. Si está dolorida después de una noche de descanso, la cama puede ser demasiado blanda. Trate de colocar una tabla entre el colchón y el somier. °· Préstele atención a su cuerpo cuando se levante. Si siente dolor,pida ayuda o trate de doblar las rodillas más para utilizar los músculos de las piernas en lugar de los músculos de la espalda. Póngase en cuclillas al levantar algo del suelo. No se doble. °· Consuma una dieta saludable. Trate de aumentar de peso dentro de las recomendaciones de su médico. °· Utilice compresas de calor o frío de 3 a 4 veces al día durante 15 minutos para calmar el dolor. °· Solo tome medicamentos que se pueden comprar sin receta o recetados para el dolor, malestar o fiebre, como le indica el médico. °Dolor de espaldas repentino (agudo). °· Haga reposo en cama sólo en caso de los episodios más extremos y agudos de dolor. El reposo prolongado en cama de más de 48 horas agravará su trastorno. °· El hielo es muy efectivo en los problemas agudos. °· Ponga el hielo en una bolsa plástica. °· Colóquese una toalla entre la piel y la bolsa de hielo. °· Deje el hielo durante 10 a 20  minutos cada 2 horas o según lo nesecite, mientras se encuentre despierta. °· Las compresas de calor durante 30 minutos antes de las actividades también puede ayudar. °Dolor crónico en la espalda. °Consulte a su médico si el dolor es continuo. El médico podrá ayudarla o derivarla para que realice los ejercicios y trabajos de fortalecimiento apropiados. Con un buen entrenamiento físico, podrá evitar la mayor parte de los problemas. En algunos casos, la causa es un problema más grave. Debe ser controlada inmediatamente si aparecen nuevos problemas. El médico también podrá recomendar:  °· Una faja de maternidad. °· Un arnés elástico. °· Un corsé para la espalda. °· Un masajista o acupuntura. °SOLICITE ATENCIÓN MÉDICA SI:  °· No puede realizar la mayor parte de sus actividades diarias, aún tomando los medicamentos para calmar el dolor que le recetaron. °· Quiere ser derivada a un fisioterapeuta o quiropráxico. °· Quiere intentar con acupuntura. °SOLICITE ATENCIÓN MÉDICA DE INMEDIATO SI:  °· Siente entumecimiento, hormigueo, debilidad o problemas con el uso de los brazos o las piernas. °· Siente un dolor de espalda muy intenso que   no se alivia con medicamentos. °· Tiene modificaciones repentinas en el control de la vejiga o el intestino. °· Aumenta el dolor en otras partes del cuerpo. °· Siente que le falta el aire, se marea o sufre un desmayo. °· Tiene náuseas, vómitos o sudoración. °· Siente un dolor en la espalda similar al del trabajo de parto. °· Cuando aparece el dolor, rompe la bolsa de aguas o tiene un sangrado vaginal. °· El dolor o el adormecimiento se extienden hacia la pierna. °· El dolor aparece después de una caída. °· Siente dolor de un solo lado. Podría tener cálculos renales. °· Observa sangre en la orina. Podría tener una infección en la vejiga o cálculos renales. °· Siente dolor y aparecen ronchas. Podría tener culebrilla. °El dolor de espalda es bastante frecuente durante el embarazo pero no debe  aceptarse sólo como parte del proceso. Siempre debe tratarse lo más rápidamente posible. Hará que su embarazo sea lo más placentero posible.  °Document Released: 09/12/2010 Document Revised: 03/25/2011 °ExitCare® Patient Information ©2014 [employer], LLC. ° °

## 2012-07-27 ENCOUNTER — Inpatient Hospital Stay (HOSPITAL_COMMUNITY)
Admission: AD | Admit: 2012-07-27 | Discharge: 2012-07-27 | Disposition: A | Discharge status: Home / Self Care | Payer: Self-pay | Source: Ambulatory Visit | Attending: Obstetrics & Gynecology | Admitting: Obstetrics & Gynecology

## 2012-07-27 ENCOUNTER — Encounter (HOSPITAL_COMMUNITY): Payer: Self-pay | Admitting: *Deleted

## 2012-07-27 DIAGNOSIS — O471 False labor at or after 37 completed weeks of gestation: Secondary | ICD-10-CM

## 2012-07-27 DIAGNOSIS — O479 False labor, unspecified: Secondary | ICD-10-CM | POA: Insufficient documentation

## 2012-07-27 DIAGNOSIS — Z3483 Encounter for supervision of other normal pregnancy, third trimester: Secondary | ICD-10-CM

## 2012-07-27 MED ORDER — OXYCODONE-ACETAMINOPHEN 5-325 MG PO TABS
2.0000 | ORAL_TABLET | Freq: Once | ORAL | Status: AC
  Administered 2012-07-27: 2 via ORAL
  Filled 2012-07-27: qty 2

## 2012-07-27 NOTE — MAU Provider Note (Signed)
  History     CSN: [id number]  Arrival date and time: 07/27/12 1137   First Provider Initiated Contact with Patient 07/27/12 1221      Chief Complaint  Patient presents with  . Labor Eval   HPI 27 y.o. [id number] at [redacted]w[redacted]d with contractions. Started last night, stopped. Started again at 8 am, stopped. Then started again at 10 am. Every 8 minutes. No loss of fluid or bleeding. Baby was not moving much this morning but feeling movement now. No fever, chills, nausea or vomiting.  Receives care in [facility]. Late to care (21 weeks). Normal 1 hour GTT and ultrasounds. Hx 2 prior term SVDs, no complications.  OB History   Grav Para Term Preterm Abortions TAB SAB Ect Mult Living   4 2 2  1  1   2       Past Medical History  Diagnosis Date  . Medical history non-contributory     History reviewed. No pertinent past surgical history.  Family History  Problem Relation Age of Onset  . Hypertension Father   . Hearing loss Neg Hx     History  Substance Use Topics  . Smoking status: Never Smoker   . Smokeless tobacco: Never Used  . Alcohol Use: No    Allergies: No Known Allergies  Prescriptions prior to admission  Medication Sig Dispense Refill  . acetaminophen (TYLENOL) 500 MG tablet Take 1 tablet (500 mg total) by mouth every 6 (six) hours as needed for pain.  30 tablet  0  . pantoprazole (PROTONIX) 40 MG tablet Take 1 tablet (40 mg total) by mouth daily.  30 tablet  3  . Prenatal Vit-Fe Fumarate-FA (PRENATAL MULTIVITAMIN) TABS Take 1 tablet by mouth daily at 12 noon.        ROS  See HPI  Physical Exam   Blood pressure 126/72, pulse 88, temperature 97.7 F (36.5 C), temperature source Oral, resp. rate 18, height 5\' 2"  (1.575 m), weight 73.755 kg (162 lb 9.6 oz), last menstrual period 11/03/2011.  Physical Exam GEN:  WNWD, no distress NECK:  Supple, non-tender, no thyromegaly, trachea midline CV: RRR, no murmur RESP:  CTAB ABD:  Soft, non-tender, no guarding or rebound, normal  bowel sounds EXTREM:  Warm, well perfused, no edema or tenderness NEURO:  Alert, oriented, no focal deficits GU:  Cervix 1 to 1.5/50/-3, medium consistency, posterior  FHTs: 120, mod var, accels present, no decels TOCO:  Not tracing well  MAU Course  Procedures   Assessment and Plan  26 y.o. [id number] at [redacted]w[redacted]d with false contractions vs early labor. - Cervix only 1-1.5 cm. Not contracting regularly or frequently -  Percocet given in MAU for comfort. - Stable for d/c home. Labor precautions discussed.   Napoleon Form 07/27/2012, 12:27 PM

## 2012-07-27 NOTE — MAU Note (Signed)
Contractions started this morning, feeling them in the front and low back. No bleeding or leaking, no problems with preg

## 2012-07-29 ENCOUNTER — Ambulatory Visit (INDEPENDENT_AMBULATORY_CARE_PROVIDER_SITE_OTHER): Payer: Self-pay | Admitting: Advanced Practice Midwife

## 2012-07-29 VITALS — BP 116/77 | Temp 97.4°F | Wt 162.0 lb

## 2012-07-29 DIAGNOSIS — Z3480 Encounter for supervision of other normal pregnancy, unspecified trimester: Secondary | ICD-10-CM

## 2012-07-29 LAB — POCT URINALYSIS DIP (DEVICE)
Glucose, UA: NEGATIVE mg/dL
Nitrite: NEGATIVE
Protein, ur: NEGATIVE mg/dL
Urobilinogen, UA: 2 mg/dL — ABNORMAL HIGH (ref 0.0–1.0)

## 2012-07-29 NOTE — Progress Notes (Signed)
Pulse: 84

## 2012-07-29 NOTE — Patient Instructions (Addendum)
Vaginal Delivery Your caregiver must first be sure you are in labor. Signs of labor include:  You may pass what is called "the mucus plug" before labor begins. This is a small amount of blood stained mucus.  Regular uterine contractions.  The time between contractions get closer together.  The discomfort and pain gradually gets more intense.  Pains are mostly located in the back.  Pains get worse when walking.  The cervix (the opening of the uterus) becomes thinner (begins to efface) and opens up (dilates). Once you are in labor and admitted into the hospital or care center, your caregiver will do the following:  A complete physical examination.  Check your vital signs (blood pressure, pulse, temperature and the fetal heart rate).  Do a vaginal examination (using a sterile glove and lubricant) to determine:  The position (presentation) of the baby (head [vertex] or buttock first).  The level (station) of the baby's head in the birth canal. The effacement and dilatation of the cervix Parto vaginal  (Vaginal Delivery) En primer lugar, su mdico debe estar seguro de que usted est en trabajo de parto. Algunos signos son: Puede haber eliminado el "tapn mucoso" antes que comience el trabajo de [place]. Se trata de una pequea cantidad de mucus con sangre. Tiene contracciones uterinas regulares. El [employer] las contracciones se acorta. Las molestias y Chief Technology Officer se hacen gradualmente ms intensos. El dolor se ubica principalmente en la espalda. Los dolores empeoran al [employer]. El cuello del tero (la apertura del tero) se hace ms delgada, comienza a borrarse, y se abre (se dilata). Una vez que se encuentre en [place] parto y sea admitida en el hospital, el mdico har lo siguiente: Un examen fsico completo. Controlar sus signos vitales (presin arterial, pulso, temperatura y la frecuencia cardaca fetal). Realizar un examen vaginal (usando un guante estril y lubricante)  para determinar: La posicin (presentacin) del beb (ceflica [vertex] o nalgas primero). El nivel (plano) de la cabeza del beb en el canal de parto. El borramiento y dilatacin del cuello del tero. Generalmente se coloca un monitor electrnico sobre el abdomen. El monitor sigue la duracin e intensidad de las contracciones, as como la frecuencia cardaca del beb. Generalmente, el profesional inserta una va intravenosa en el brazo para administrarle agua azucarada. Esta es una medida de precaucin, de modo que puedan administrarle rpidamente medicamentos durante el [place]. EL TRABAJO DE PARTO Y PARTO NORMALES SE DIVIDEN EN 3 ETAPAS: Primera etapa Comienzan las contracciones regulares y el cuello comienza a borrarse y dilatarse. Esta etapa puede durar entre 3 y 15 horas. El final de la primera etapa se considera cuando el cuello est borrado en un 100% y se ha dilatado 10 cm. Le administrarn analgsicos por: Inyeccin (morfina, demerol, etc.). Anestesia regional (espinal, caudal o epidural, anestsicos colocados en diferentes regiones de la columna vertebral). Podrn administrarle medicamentos para el dolor en la regin paracervical, que consiste en la aplicacin de un anestsico inyectable en cada uno de los lados del cuello del tero. La embarazada puede requerir un "parto natural", es decir no recibir medicamentos o anestesia durante el [place]. Segunda etapa En este momento el beb baja a travs del canal de parto (vagina) y nace. Esto puede durar entre 1 y 4 horas. A medida que el beb asoma la cabeza por el canal de parto, podr sentir una sensacin similar a cuando mueve el intestino. Sentir el impulse de empujar con fuerza [employer]  el nio salga. A medida que la cabecita baja, el mdico decidir si realiza una episiotoma (corte en el perineo y rea de la vagina) para evitar la ruptura de los tejidos. Luego del nacimiento del beb y la expulsin de la  placenta, la episiotoma se sutura. En algunos casos se coloca a la madre una mscara con xido nitroso para Research officer, political party respiracin y Engineer, materials. El final de la etapa 2 se produce cuando el beb ha salido completamente. Luego, cuando el cordn umbilical deja de pulsar, se pinza y se corta. Tercera etapa La tercera etapa comienza luego que el beb ha nacido y finaliza luego de la expulsin de la placenta. Generalmente esto lleva entre 5 y 30 minutos. Luego de la expulsin de la placenta, le aplicarn un medicamento por va intravenosa para ayudar a Engineer, materials y Psychiatric nurse. En la tercera etapa no hay dolor y generalmente no son necesarios los analgsicos. Si le han realizado una episiotoma, es el momento de Sales promotion account executive. Luego del parto, la mam es observada y controlada exhaustivamente durante 1  2 horas para verificar que no hay sangrado en el post parto (hemorragias). Si pierde [employer], le administrarn un medicamento para Engineer, manufacturing tero y Comptroller. Document Released: 12/14/2007 Document Revised: 09/25/2011 [facility] Patient Information 2014 [place], [place].    An electronic monitor is usually placed on your abdomen. The monitor follows the length and intensity of the contractions, as well as the baby's heart rate.  Usually, your caregiver will insert an IV in your arm with a bottle of sugar water. This is done as a precaution so that medications can be given to you quickly during labor or delivery. NORMAL LABOR AND DELIVERY IS DIVIDED UP INTO 3 STAGES: First Stage This is when regular contractions begin and the cervix begins to efface and dilate. This stage can last from 3 to 15 hours. The end of the first stage is when the cervix is 100% effaced and 10 centimeters dilated. Pain medications may be given by   Injection (morphine, demerol, etc.)  Regional anesthesia (spinal, caudal or epidural, anesthetics given in different locations of the spine).  Paracervical pain medication may be given, which is an injection of and anesthetic on each side of the cervix. A pregnant woman may request to have "Natural Childbirth" which is not to have any medications or anesthesia during her labor and delivery. Second Stage This is when the baby comes down through the birth canal (vagina) and is born. This can take 1 to 4 hours. As the baby's head comes down through the birth canal, you may feel like you are going to have a bowel movement. You will get the urge to bear down and push until the baby is delivered. As the baby's head is being delivered, the caregiver will decide if an episiotomy (a cut in the perineum and vagina area) is needed to prevent tearing of the tissue in this area. The episiotomy is sewn up after the delivery of the baby and placenta. Sometimes a mask with nitrous oxide is given for the mother to breath during the delivery of the baby to help if there is too much pain. The end of Stage 2 is when the baby is fully delivered. Then when the umbilical cord stops pulsating it is clamped and cut. Third Stage The third stage begins after the baby is completely delivered and ends after the placenta (afterbirth) is delivered. This usually takes 5 to 30 minutes. After  the placenta is delivered, a medication is given either by intravenous or injection to help contract the uterus and prevent bleeding. The third stage is not painful and pain medication is usually not necessary. If an episiotomy was done, it is repaired at this time. After the delivery, the mother is watched and monitored closely for 1 to 2 hours to make sure there is no postpartum bleeding (hemorrhage). If there is a lot of bleeding, medication is given to contract the uterus and stop the bleeding. Document Released: 10/10/2007 Document Revised: 09/25/2011 Document Reviewed: 10/10/2007 [facility] Patient Information 2014 [place], [place].

## 2012-07-29 NOTE — Progress Notes (Signed)
Doing well. UCs off and on. Reviewed signs of labor

## 2012-08-05 ENCOUNTER — Ambulatory Visit (INDEPENDENT_AMBULATORY_CARE_PROVIDER_SITE_OTHER): Payer: Self-pay | Admitting: Family Medicine

## 2012-08-05 VITALS — BP 118/74 | Temp 97.3°F | Wt 160.3 lb

## 2012-08-05 DIAGNOSIS — O9989 Other specified diseases and conditions complicating pregnancy, childbirth and the puerperium: Secondary | ICD-10-CM

## 2012-08-05 DIAGNOSIS — Z3480 Encounter for supervision of other normal pregnancy, unspecified trimester: Secondary | ICD-10-CM

## 2012-08-05 LAB — POCT URINALYSIS DIP (DEVICE)
Bilirubin Urine: NEGATIVE
Glucose, UA: NEGATIVE mg/dL
Nitrite: NEGATIVE

## 2012-08-05 NOTE — Progress Notes (Signed)
Pulse- 82  Pain/pressure- lower abd, vaginal pressure

## 2012-08-05 NOTE — Progress Notes (Signed)
Occasional contraction. No complaints.

## 2012-08-05 NOTE — Patient Instructions (Signed)
Parto vaginal  (Vaginal Delivery) En primer lugar, su mdico debe estar seguro de que usted est en trabajo de parto. Algunos signos son:  Puede haber eliminado el "tapn mucoso" antes que comience el trabajo de parto. Se trata de una pequea cantidad de mucus con sangre.  Tiene contracciones uterinas regulares.  El tiempo entre las contracciones se acorta.  Las molestias y el dolor se hacen gradualmente ms intensos.  El dolor se ubica principalmente en la espalda.  Los dolores empeoran al caminar.  El cuello del tero (la apertura del tero) se hace ms delgada, comienza a borrarse, y se abre (se dilata). Una vez que se encuentre en trabajo de parto y sea admitida en el hospital, el mdico har lo siguiente:  Un examen fsico completo.  Controlar sus signos vitales (presin arterial, pulso, temperatura y la frecuencia cardaca fetal).  Realizar un examen vaginal (usando un guante estril y lubricante) para determinar:  La posicin (presentacin) del beb (ceflica [vertex] o nalgas primero).  El nivel (plano) de la cabeza del beb en el canal de parto.  El borramiento y dilatacin del cuello del tero.  Le rasurarn el vello pbico y le aplicarn una enema segn lo considere el mdico y las circunstancias.  Generalmente se coloca un monitor electrnico sobre el abdomen. El monitor sigue la duracin e intensidad de las contracciones, as como la frecuencia cardaca del beb.  Generalmente, el profesional inserta una va intravenosa en el brazo para administrarle agua azucarada. Esta es una medida de precaucin, de modo que puedan administrarle rpidamente medicamentos durante el trabajo de parto. EL TRABAJO DE PARTO Y PARTO NORMALES SE DIVIDEN EN 3 ETAPAS: Primera etapa Comienzan las contracciones regulares y el cuello comienza a borrarse y dilatarse. Esta etapa puede durar entre 3 y 15 horas. El final de la primera etapa se considera cuando el cuello est borrado en un 100%  y se ha dilatado 10 cm. Le administrarn analgsicos por:  Inyeccin (morfina, demerol, etc.).  Anestesia regional (espinal, caudal o epidural, anestsicos colocados en diferentes regiones de la columna vertebral). Podrn administrarle medicamentos para el dolor en la regin paracervical, que consiste en la aplicacin de un anestsico inyectable en cada uno de los lados del cuello del tero. La embarazada puede requerir un "parto natural", es decir no recibir medicamentos o anestesia durante el trabajo de parto y el parto. Segunda etapa En este momento el beb baja a travs del canal de parto (vagina) y nace. Esto puede durar entre 1 y 4 horas. A medida que el beb asoma la cabeza por el canal de parto, podr sentir una sensacin similar a cuando mueve el intestino. Sentir el impulse de empujar con fuerza hasta que el nio salga. A medida que la cabecita baja, el mdico decidir si realiza una episiotoma (corte en el perineo y rea de la vagina) para evitar la ruptura de los tejidos. Luego del nacimiento del beb y la expulsin de la placenta, la episiotoma se sutura. En algunos casos se coloca a la madre una mscara con xido nitroso para facilitar la respiracin y aliviar el dolor. El final de la etapa 2 se produce cuando el beb ha salido completamente. Luego, cuando el cordn umbilical deja de pulsar, se pinza y se corta. Tercera etapa La tercera etapa comienza luego que el beb ha nacido y finaliza luego de la expulsin de la placenta. Generalmente esto lleva entre 5 y 30 minutos. Luego de la expulsin de la placenta, le aplicarn un medicamento por   va intravenosa para ayudar a contraer el tero y prevenir hemorragias. En la tercera etapa no hay dolor y generalmente no son necesarios los analgsicos. Si le han realizado una episiotoma, es el momento de repararla. Luego del parto, la mam es observada y controlada exhaustivamente durante 1  2 horas para verificar que no hay sangrado en el post  parto (hemorragias). Si pierde mucha sangre, le administrarn un medicamento para contraer el tero y detener la hemorragia. Document Released: 12/14/2007 Document Revised: 09/25/2011 [employer] Patient Information 2014 [employer].  

## 2012-08-06 ENCOUNTER — Inpatient Hospital Stay (HOSPITAL_COMMUNITY): Payer: Medicaid Other | Admitting: Anesthesiology

## 2012-08-06 ENCOUNTER — Inpatient Hospital Stay (HOSPITAL_COMMUNITY)
Admission: AD | Admit: 2012-08-06 | Discharge: 2012-08-08 | DRG: 774 | Disposition: A | Discharge status: Home / Self Care | Payer: Medicaid Other | Source: Ambulatory Visit | Attending: Obstetrics & Gynecology | Admitting: Obstetrics & Gynecology

## 2012-08-06 ENCOUNTER — Encounter (HOSPITAL_COMMUNITY): Payer: Self-pay | Admitting: Anesthesiology

## 2012-08-06 ENCOUNTER — Encounter (HOSPITAL_COMMUNITY): Payer: Self-pay | Admitting: *Deleted

## 2012-08-06 DIAGNOSIS — Z3482 Encounter for supervision of other normal pregnancy, second trimester: Secondary | ICD-10-CM

## 2012-08-06 DIAGNOSIS — IMO0001 Reserved for inherently not codable concepts without codable children: Secondary | ICD-10-CM

## 2012-08-06 DIAGNOSIS — Z603 Acculturation difficulty: Secondary | ICD-10-CM

## 2012-08-06 LAB — CBC
HCT: 33 % — ABNORMAL LOW (ref 36.0–46.0)
Hemoglobin: 10.8 g/dL — ABNORMAL LOW (ref 12.0–15.0)
MCH: 28.6 pg (ref 26.0–34.0)
MCHC: 32.7 g/dL (ref 30.0–36.0)
MCV: 87.5 fL (ref 78.0–100.0)
RDW: 14.2 % (ref 11.5–15.5)

## 2012-08-06 LAB — OB RESULTS CONSOLE GBS: GBS: NEGATIVE

## 2012-08-06 MED ORDER — DIPHENHYDRAMINE HCL 25 MG PO CAPS: 25.0000 mg | ORAL_CAPSULE | Freq: Four times a day (QID) | ORAL | Status: DC | PRN

## 2012-08-06 MED ORDER — OXYTOCIN BOLUS FROM INFUSION
500.0000 mL | INTRAVENOUS | Status: DC
  Administered 2012-08-06: 500 mL via INTRAVENOUS

## 2012-08-06 MED ORDER — ONDANSETRON HCL 4 MG/2ML IJ SOLN: 4.0000 mg | INTRAMUSCULAR | Status: DC | PRN

## 2012-08-06 MED ORDER — FENTANYL 2.5 MCG/ML BUPIVACAINE 1/10 % EPIDURAL INFUSION (WH - ANES)
INTRAMUSCULAR | Status: DC | PRN
  Administered 2012-08-06: 14 mL/h via EPIDURAL

## 2012-08-06 MED ORDER — LIDOCAINE HCL (PF) 1 % IJ SOLN
30.0000 mL | INTRAMUSCULAR | Status: DC | PRN
  Filled 2012-08-06 (×2): qty 30

## 2012-08-06 MED ORDER — LACTATED RINGERS IV SOLN: 500.0000 mL | INTRAVENOUS | Status: DC | PRN

## 2012-08-06 MED ORDER — LANOLIN HYDROUS EX OINT: TOPICAL_OINTMENT | CUTANEOUS | Status: DC | PRN

## 2012-08-06 MED ORDER — ONDANSETRON HCL 4 MG/2ML IJ SOLN: 4.0000 mg | Freq: Four times a day (QID) | INTRAMUSCULAR | Status: DC | PRN

## 2012-08-06 MED ORDER — LACTATED RINGERS IV SOLN
500.0000 mL | Freq: Once | INTRAVENOUS | Status: AC
  Administered 2012-08-06: 500 mL via INTRAVENOUS

## 2012-08-06 MED ORDER — LACTATED RINGERS IV SOLN
INTRAVENOUS | Status: DC
  Administered 2012-08-06 (×2): via INTRAVENOUS

## 2012-08-06 MED ORDER — WITCH HAZEL-GLYCERIN EX PADS: 1.0000 "application " | MEDICATED_PAD | CUTANEOUS | Status: DC | PRN

## 2012-08-06 MED ORDER — LIDOCAINE HCL (PF) 1 % IJ SOLN
INTRAMUSCULAR | Status: DC | PRN
  Administered 2012-08-06: 9 mL
  Administered 2012-08-06: 8 mL

## 2012-08-06 MED ORDER — PRENATAL MULTIVITAMIN CH
1.0000 | ORAL_TABLET | Freq: Every day | ORAL | Status: DC
  Administered 2012-08-07 – 2012-08-08 (×2): 1 via ORAL
  Filled 2012-08-06 (×2): qty 1

## 2012-08-06 MED ORDER — FLEET ENEMA 7-19 GM/118ML RE ENEM: 1.0000 | ENEMA | RECTAL | Status: DC | PRN

## 2012-08-06 MED ORDER — SIMETHICONE 80 MG PO CHEW: 80.0000 mg | CHEWABLE_TABLET | ORAL | Status: DC | PRN

## 2012-08-06 MED ORDER — OXYCODONE-ACETAMINOPHEN 5-325 MG PO TABS
1.0000 | ORAL_TABLET | ORAL | Status: DC | PRN
  Administered 2012-08-06 – 2012-08-07 (×2): 1 via ORAL
  Filled 2012-08-06 (×2): qty 1

## 2012-08-06 MED ORDER — ZOLPIDEM TARTRATE 5 MG PO TABS: 5.0000 mg | ORAL_TABLET | Freq: Every evening | ORAL | Status: DC | PRN

## 2012-08-06 MED ORDER — EPHEDRINE 5 MG/ML INJ
10.0000 mg | INTRAVENOUS | Status: DC | PRN
  Filled 2012-08-06: qty 2

## 2012-08-06 MED ORDER — CITRIC ACID-SODIUM CITRATE 334-500 MG/5ML PO SOLN: 30.0000 mL | ORAL | Status: DC | PRN

## 2012-08-06 MED ORDER — ONDANSETRON HCL 4 MG PO TABS: 4.0000 mg | ORAL_TABLET | ORAL | Status: DC | PRN

## 2012-08-06 MED ORDER — BENZOCAINE-MENTHOL 20-0.5 % EX AERO: 1.0000 "application " | INHALATION_SPRAY | CUTANEOUS | Status: DC | PRN

## 2012-08-06 MED ORDER — FENTANYL CITRATE 0.05 MG/ML IJ SOLN: 100.0000 ug | INTRAMUSCULAR | Status: DC | PRN

## 2012-08-06 MED ORDER — SENNOSIDES-DOCUSATE SODIUM 8.6-50 MG PO TABS
2.0000 | ORAL_TABLET | Freq: Every day | ORAL | Status: DC
  Administered 2012-08-06 – 2012-08-07 (×2): 2 via ORAL

## 2012-08-06 MED ORDER — PHENYLEPHRINE 40 MCG/ML (10ML) SYRINGE FOR IV PUSH (FOR BLOOD PRESSURE SUPPORT)
80.0000 ug | PREFILLED_SYRINGE | INTRAVENOUS | Status: DC | PRN
  Filled 2012-08-06: qty 2

## 2012-08-06 MED ORDER — OXYTOCIN 40 UNITS IN LACTATED RINGERS INFUSION - SIMPLE MED
62.5000 mL/h | INTRAVENOUS | Status: DC
  Filled 2012-08-06: qty 1000

## 2012-08-06 MED ORDER — FENTANYL 2.5 MCG/ML BUPIVACAINE 1/10 % EPIDURAL INFUSION (WH - ANES)
14.0000 mL/h | INTRAMUSCULAR | Status: DC | PRN
  Filled 2012-08-06: qty 125

## 2012-08-06 MED ORDER — DIBUCAINE 1 % RE OINT: 1.0000 "application " | TOPICAL_OINTMENT | RECTAL | Status: DC | PRN

## 2012-08-06 MED ORDER — IBUPROFEN 600 MG PO TABS
600.0000 mg | ORAL_TABLET | Freq: Four times a day (QID) | ORAL | Status: DC
  Administered 2012-08-07 – 2012-08-08 (×8): 600 mg via ORAL
  Filled 2012-08-06 (×8): qty 1

## 2012-08-06 MED ORDER — DIPHENHYDRAMINE HCL 50 MG/ML IJ SOLN: 12.5000 mg | INTRAMUSCULAR | Status: DC | PRN

## 2012-08-06 MED ORDER — ACETAMINOPHEN 325 MG PO TABS: 650.0000 mg | ORAL_TABLET | ORAL | Status: DC | PRN

## 2012-08-06 MED ORDER — TETANUS-DIPHTH-ACELL PERTUSSIS 5-2.5-18.5 LF-MCG/0.5 IM SUSP
0.5000 mL | Freq: Once | INTRAMUSCULAR | Status: AC
  Administered 2012-08-07: 0.5 mL via INTRAMUSCULAR

## 2012-08-06 MED ORDER — IBUPROFEN 600 MG PO TABS
600.0000 mg | ORAL_TABLET | Freq: Four times a day (QID) | ORAL | Status: DC | PRN
  Administered 2012-08-06: 600 mg via ORAL
  Filled 2012-08-06: qty 1

## 2012-08-06 MED ORDER — CEFAZOLIN SODIUM 1-5 GM-% IV SOLN
1.0000 g | Freq: Once | INTRAVENOUS | Status: AC
  Administered 2012-08-06: 1 g via INTRAVENOUS
  Filled 2012-08-06: qty 50

## 2012-08-06 MED ORDER — EPHEDRINE 5 MG/ML INJ
10.0000 mg | INTRAVENOUS | Status: DC | PRN
  Filled 2012-08-06: qty 2
  Filled 2012-08-06: qty 4

## 2012-08-06 MED ORDER — PHENYLEPHRINE 40 MCG/ML (10ML) SYRINGE FOR IV PUSH (FOR BLOOD PRESSURE SUPPORT)
80.0000 ug | PREFILLED_SYRINGE | INTRAVENOUS | Status: DC | PRN
  Filled 2012-08-06: qty 2
  Filled 2012-08-06: qty 5

## 2012-08-06 MED ORDER — OXYCODONE-ACETAMINOPHEN 5-325 MG PO TABS: 1.0000 | ORAL_TABLET | ORAL | Status: DC | PRN

## 2012-08-06 NOTE — Anesthesia Preprocedure Evaluation (Signed)
Anesthesia Evaluation  Patient identified by MRN, date of birth, ID band Patient awake    Reviewed: Allergy & Precautions, H&P , Patient's Chart, lab work & pertinent test results  Airway Mallampati: II TM Distance: >3 FB Neck ROM: full    Dental no notable dental hx.    Pulmonary neg pulmonary ROS,    Pulmonary exam normal       Cardiovascular negative cardio ROS      Neuro/Psych negative neurological ROS  negative psych ROS   GI/Hepatic negative GI ROS, Neg liver ROS,   Endo/Other  negative endocrine ROS  Renal/GU negative Renal ROS  negative genitourinary   Musculoskeletal negative musculoskeletal ROS (+)   Abdominal Normal abdominal exam  (+)   Peds negative pediatric ROS (+)  Hematology negative hematology ROS (+)   Anesthesia Other Findings   Reproductive/Obstetrics (+) Pregnancy                           Anesthesia Physical Anesthesia Plan  ASA: II  Anesthesia Plan: Epidural   Post-op Pain Management:    Induction:   Airway Management Planned:   Additional Equipment:   Intra-op Plan:   Post-operative Plan:   Informed Consent: I have reviewed the patients History and Physical, chart, labs and discussed the procedure including the risks, benefits and alternatives for the proposed anesthesia with the patient or authorized representative who has indicated his/her understanding and acceptance.     Plan Discussed with:   Anesthesia Plan Comments:         Anesthesia Quick Evaluation  

## 2012-08-06 NOTE — Anesthesia Procedure Notes (Signed)
Epidural Patient location during procedure: OB Start time: 08/06/2012 9:43 AM End time: 08/06/2012 9:47 AM  Staffing Anesthesiologist: Sandrea Hughs Performed by: anesthesiologist   Preanesthetic Checklist Completed: patient identified, surgical consent, pre-op evaluation, timeout performed, IV checked, risks and benefits discussed and monitors and equipment checked  Epidural Patient position: sitting Prep: site prepped and draped and DuraPrep Patient monitoring: continuous pulse ox and blood pressure Approach: midline Injection technique: LOR air  Needle:  Needle type: Tuohy  Needle gauge: 17 G Needle length: 9 cm and 9 Needle insertion depth: 4 cm Catheter type: closed end flexible Catheter size: 19 Gauge Catheter at skin depth: 10 cm Test dose: negative and Other  Assessment Sensory level: T9 Events: blood not aspirated, injection not painful, no injection resistance, negative IV test and no paresthesia  Additional Notes Reason for block:procedure for pain

## 2012-08-06 NOTE — Progress Notes (Signed)
Jacqueline Graves is a 27 y.o. [id number] at [redacted]w[redacted]d byadmitted for active labor  Subjective: Pain well controlled s/p epidural. No complaints at this time.  Objective: BP 120/65  Pulse 74  Temp(Src) 98.2 F (36.8 C) (Oral)  Resp 17  Ht 5\' 2"  (1.575 m)  Wt 163 lb (73.936 kg)  BMI 29.81 kg/m2  SpO2 97%  LMP 11/03/2011   Total I/O In: -  Out: 500 [Urine:500]  FHT:  FHR: 130 bpm, variability: moderate,  accelerations:  Abscent,  decelerations:  Absent UC:   irregular, every 6-8 minutes SVE:   Dilation: 6.5 Effacement (%): 80 Station: -2;-1 Exam by:: Renaldo Harrison, RN  Labs: Lab Results  Component Value Date   WBC 9.9 08/06/2012   HGB 10.8* 08/06/2012   HCT 33.0* 08/06/2012   MCV 87.5 08/06/2012   PLT 218 08/06/2012    Assessment / Plan: Spontaneous labor, progressing normally  Labor: Progressing normally irregular ctx pattern but making change. No augmentation at this time Preeclampsia:  no signs or symptoms of toxicity Fetal Wellbeing:  Category I Pain Control:  Epidural I/D:  n/a Anticipated MOD:  NSVD  Jacqueline Graves, Jacqueline Graves 08/06/2012, 11:48 AM

## 2012-08-06 NOTE — Progress Notes (Signed)
Interpreter, Eta, present for admission process. 

## 2012-08-06 NOTE — H&P (Signed)
Jacqueline Graves is a 27 y.o. female presenting for onset of labor. Maternal Medical History:  Reason for admission: Contractions.  Nausea.  Contractions: Onset was 6-12 hours ago.   Frequency: regular.   Duration is approximately 1 minute.   Perceived severity is moderate.    Fetal activity: Perceived fetal activity is normal.   Last perceived fetal movement was within the past hour.    Prenatal Complications - Diabetes: none.   HPI 27 y.o. [id number] at [redacted]w[redacted]d presents for labor eval. Contractions started last night around 1130pm, went away for a little while and came back around 5am, now q4-6 minutes. No gush of fluid, no bleeding, no vaginal discharge. +FM. Denies chest pain, shortness of breath, headache, dizziness, changes in vision.  Prenatal course: Care at [facility] - Presented late (21wks) - Normal anatomy on f/u U/S - Normal GTT  OB History   Grav Para Term Preterm Abortions TAB SAB Ect Mult Living   4 2 2  1  1   2     Prev SVD  Past Medical History  Diagnosis Date  . Medical history non-contributory    History reviewed. No pertinent past surgical history. Family History: family history includes Hypertension in her father.  There is no history of Hearing loss. Social History:  reports that she has never smoked. She has never used smokeless tobacco. She reports that she does not drink alcohol or use illicit drugs.   Prenatal Transfer Tool  Maternal Diabetes: No Genetic Screening: Declined Maternal Ultrasounds/Referrals: Normal Fetal Ultrasounds or other Referrals:  None Maternal Substance Abuse:  No Significant Maternal Medications:  None Significant Maternal Lab Results:  Lab values include: Group B Strep negative Other Comments:  None  Review of Systems  Constitutional: Negative for fever and chills.  Eyes: Negative for blurred vision and double vision.  Respiratory: Negative for shortness of breath.   Cardiovascular: Negative for chest pain.   Gastrointestinal: Negative for heartburn, nausea and vomiting.  Neurological: Negative for dizziness and headaches.    Dilation: 5 Effacement (%): 80 Station: -1 Exam by:: Dorrene German RN; Elonda Husky RN Blood pressure 117/78, pulse 91, temperature 98.3 F (36.8 C), temperature source Oral, resp. rate 18, height 5\' 2"  (1.575 m), weight 73.936 kg (163 lb), last menstrual period 11/03/2011. Maternal Exam:  Uterine Assessment: Contraction strength is moderate.  Contraction duration is 1 minute. Contraction frequency is regular.   Abdomen: Fetal presentation: vertex  Pelvis: adequate for delivery.   Cervix: Cervix evaluated by digital exam.     Fetal Exam Fetal Monitor Review: Baseline rate: 125.  Variability: moderate (6-25 bpm).   Pattern: accelerations present and no decelerations.    Fetal State Assessment: Category I - tracings are normal.     Physical Exam  Constitutional: She is oriented to person, place, and time. She appears well-developed and well-nourished.  HENT:  Head: Normocephalic and atraumatic.  Cardiovascular: Normal rate, regular rhythm, normal heart sounds and intact distal pulses.  Exam reveals no gallop and no friction rub.   No murmur heard. Respiratory: Effort normal and breath sounds normal.  GI: Soft. There is no tenderness.  Neurological: She is alert and oriented to person, place, and time.  Skin: Skin is warm and dry.    Prenatal labs: ABO, Rh: O/POS/-- (02/17 1044) Antibody: NEG (02/17 1044) Rubella: 14.10 (02/17 1044) RPR: NON REAC (04/30 1139)  HBsAg: NEGATIVE (02/17 1044)  HIV: NON REACTIVE (04/30 1139)  GBS: Negative (07/24 0000)   Assessment/Plan: 26 y.o. [id number] [redacted]w[redacted]d  Onset of labor FHT reassuring Normal L&D orders Wants an epidural Expectant management for SVD   Tawni Carnes 08/06/2012, 8:28 AM

## 2012-08-06 NOTE — MAU Provider Note (Signed)
  History     CSN: [id number]  Arrival date and time: 08/06/12 0605   None     Chief Complaint  Patient presents with  . Labor Eval   HPI 27 y.o. [id number] at [redacted]w[redacted]d presents for labor eval. Contractions started last night around 1130pm, went away for a little while and came back around 5am, now q4-6 minutes. No gush of fluid, no bleeding, no vaginal discharge. +FM. Denies chest pain, shortness of breath, headache, dizziness, changes in vision.  Prenatal course: Care at [facility] - Presented late (21wks) - Normal anatomy on f/u U/S - Normal GTT  OB History   Grav Para Term Preterm Abortions TAB SAB Ect Mult Living   4 2 2  1  1   2     Previous SVD  Past Medical History  Diagnosis Date  . Medical history non-contributory     History reviewed. No pertinent past surgical history.  Family History  Problem Relation Age of Onset  . Hypertension Father   . Hearing loss Neg Hx     History  Substance Use Topics  . Smoking status: Never Smoker   . Smokeless tobacco: Never Used  . Alcohol Use: No    Allergies: No Known Allergies  Prescriptions prior to admission  Medication Sig Dispense Refill  . acetaminophen (TYLENOL) 500 MG tablet Take 1 tablet (500 mg total) by mouth every 6 (six) hours as needed for pain.  30 tablet  0  . pantoprazole (PROTONIX) 40 MG tablet Take 1 tablet (40 mg total) by mouth daily.  30 tablet  3  . Prenatal Vit-Fe Fumarate-FA (PRENATAL MULTIVITAMIN) TABS Take 1 tablet by mouth daily at 12 noon.        ROS negative except as above Physical Exam   Blood pressure 117/78, pulse 91, temperature 98.3 F (36.8 C), temperature source Oral, resp. rate 18, height 5\' 2"  (1.575 m), weight 73.936 kg (163 lb), last menstrual period 11/03/2011.  Physical Exam General appearance: alert, cooperative and no distress Head: Normocephalic, without obvious abnormality, atraumatic Lungs: clear to auscultation bilaterally Heart: regular rate and rhythm, S1, S2 normal,  no murmur, click, rub or gallop Abdomen: soft, gravid, nontender Extremities: no edema, redness or tenderness in the calves or thighs Pulses: 2+ and symmetric DP Skin: warm and dry  Dilation: 4.5 Effacement (%): 70 Station: -1 Presentation: Vertex Exam by:: Cira Servant RN  FHT: 125bpm, mod var, accels present, no decels Toco: not tracing well  MAU Course  Procedures  MDM  Re-check in an hour Dilation: 5 Effacement (%): 80 Station: -1 Presentation: Vertex Exam by:: Dorrene German RN; Elonda Husky RN   Assessment and Plan  27 y.o. [phone] [redacted]w[redacted]d   Active labor Admit to L&D See H&P  Tawni Carnes 08/06/2012, 6:30 AM   I have seen and examined this patient and I agree with the above. Cam Hai 11:36 PM 08/11/2012

## 2012-08-07 MED ORDER — IBUPROFEN 600 MG PO TABS: 600.0000 mg | ORAL_TABLET | Freq: Four times a day (QID) | ORAL | Status: AC | PRN

## 2012-08-07 NOTE — Lactation Note (Signed)
This note was copied from the chart of Girl Moxie Kalil. Lactation Consultation Note Mom is having difficulty breast feeding baby, even using the nipple shield. No latch achieved at last attempt; mom gave some formula because baby was hungry and could not latch. Mom states she does still desire to breast feed. Inst mom to call when ready for assistance with latch. Eta interpreter present for consult.  Patient Name: Girl Kissa Campoy [id number] Date: 08/07/2012 Reason for consult: Follow-up assessment;Difficult latch   Maternal Data    Feeding Feeding Type: Formula Length of feed:  (a few sucks)  LATCH Score/Interventions Latch: Repeated attempts needed to sustain latch, nipple held in mouth throughout feeding, stimulation needed to elicit sucking reflex.  Audible Swallowing: None  Type of Nipple: Everted at rest and after stimulation  Comfort (Breast/Nipple): Soft / non-tender     Hold (Positioning): Assistance needed to correctly position infant at breast and maintain latch. Intervention(s): Support Pillows;Position options  LATCH Score: 6  Lactation Tools Discussed/Used     Consult Status Consult Status: Follow-up Follow-up type: In-patient    Octavio Manns [facility] 08/07/2012, 12:16 PM

## 2012-08-07 NOTE — Anesthesia Postprocedure Evaluation (Signed)
  Anesthesia Post-op Note  Patient: Jacqueline Graves  Procedure(s) Performed: * No procedures listed *  Patient Location: PACU and Mother/Baby  Anesthesia Type:Epidural  Level of Consciousness: awake, alert , oriented and patient cooperative  Airway and Oxygen Therapy: Patient Spontanous Breathing  Post-op Pain: mild  Post-op Assessment: Patient's Cardiovascular Status Stable, Respiratory Function Stable, Patent Airway, No signs of Nausea or vomiting and Adequate PO intake  Post-op Vital Signs: Reviewed and stable  Complications: No apparent anesthesia complications

## 2012-08-07 NOTE — Discharge Summary (Cosign Needed)
Obstetric Discharge Summary Reason for Admission: onset of labor Prenatal Procedures: none Intrapartum Procedures: spontaneous vaginal delivery Postpartum Procedures: none Complications-Operative and Postpartum: none Hemoglobin  Date Value Range Status  08/06/2012 10.8* 12.0 - 15.0 g/dL Final     HCT  Date Value Range Status  08/06/2012 33.0* 36.0 - 46.0 % Final    Physical Exam:  General: alert, cooperative, appears stated age and no distress Lochia: appropriate decreasing from prior Uterine Fundus: firm @U -2 Incision: NA DVT Evaluation: No evidence of DVT seen on physical exam. Negative Homan's sign. No cords or calf tenderness. No significant calf/ankle edema.  Discharge Diagnoses: Term Pregnancy-delivered  Discharge Information: Date: 08/07/2012 Activity: pelvic rest and 6wks Diet: routine Medications: Ibuprofen, depo in clinic Condition: stable Instructions: refer to practice specific booklet Discharge to: home Follow-up Information   Follow up with [facility] In 6 weeks. (If symptoms worsen as needed)    Contact information:   [address] [place] [address] [phone]      Newborn Data: Live born female  Birth Weight: 6 lb 10.9 oz (3030 g) APGAR: 9, 9  Home with mother.  Jolyn Lent, RYAN 08/07/2012, 7:51 AM

## 2012-08-07 NOTE — Progress Notes (Signed)
UR chart review completed.  

## 2012-08-08 DIAGNOSIS — Z603 Acculturation difficulty: Secondary | ICD-10-CM

## 2012-08-08 NOTE — Discharge Summary (Signed)
Obstetric Discharge Summary Reason for Admission: onset of labor Prenatal Procedures: NST Intrapartum Procedures: spontaneous vaginal delivery Postpartum Procedures: none Complications-Operative and Postpartum: none Hemoglobin  Date Value Range Status  08/06/2012 10.8* 12.0 - 15.0 g/dL Final     HCT  Date Value Range Status  08/06/2012 33.0* 36.0 - 46.0 % Final  Hospital course: HPI  27 y.o. [id number] at [redacted]w[redacted]d presents for labor eval. Contractions started last night around 1130pm, went away for a little while and came back around 5am, now q4-6 minutes. No gush of fluid, no bleeding, no vaginal discharge. +FM. Denies chest pain, shortness of breath, headache, dizziness, changes in vision.  Prenatal course:  Care at [facility]  - Presented late (21wks)  - Normal anatomy on f/u U/S  - Normal GTT Delivery  08/06/2012 3:35 PM by Vaginal, Spontaneous Delivery  Sex: female Gestational Age: [redacted]w[redacted]d  Delivery Clinician: Jolyn Lent  Living?: Yes  APGARS  One minute  Five minutes  Ten minutes   Skin color:  1  1    Heart rate:  2  2    Grimace:  2  2    Muscle tone:  2  2    Breathing:  2  2    Totals:  9  9    Presentation/position: Vertex Right Occiput Anterior  Resuscitation:  None   Cord information: 3 vessels Disposition of cord blood: No Blood gases sent? No  Complications:  None   Placenta: Delivered: 08/06/2012 3:40 PM Spontaneous Intact appearance  Newborn Measurements:  Weight: Height: Head circumference: Chest circumference:  Other providers:  Delivery Nurse  Midwife  Marlaine Hind  Melissa Noon      Physical Exam:  General: alert and no distress Lochia: appropriate Uterine Fundus: firm Incision: n/a DVT Evaluation: No evidence of DVT seen on physical exam.  Discharge Diagnoses: Term Pregnancy-delivered  Discharge Information: Date: 08/08/2012 Activity: unrestricted and pelvic rest Diet: routine Medications: Ibuprofen Condition: stable and  improved Instructions: refer to practice specific booklet Discharge to: home Follow-up Information   Follow up with [facility] In 4 weeks. (If symptoms worsen as needed)    Contact information:   [address] [place] [address] [phone]      Newborn Data: Live born female  Birth Weight: 6 lb 10.9 oz (3030 g) APGAR: 9, 9  Home with mother.  Interpretor used  [facility] 08/08/2012, 7:13 AM

## 2012-08-09 NOTE — Discharge Summary (Signed)
Attestation of Attending Supervision of Advanced Practitioner: Evaluation and management procedures were performed by the PA/NP/CNM/OB Fellow under my supervision/collaboration. Chart reviewed and agree with management and plan.  Cosima Prentiss V 08/09/2012 9:37 PM   

## 2012-08-12 ENCOUNTER — Encounter: Payer: Self-pay | Admitting: Family Medicine

## 2012-08-14 NOTE — H&P (Signed)
  I spoke with and examined patient and agree with resident's note and plan of care.  Tawana Scale, MD OB Fellow 08/14/2012 12:32 PM

## 2013-11-09 IMAGING — US US OB DETAIL+14 WK
1 series · 12 of 28 positions shown · non-contrast
Comparison: none

[Series 1: us ob detail +14 wk · 12 of 70 slices shown]
[im 3/70]
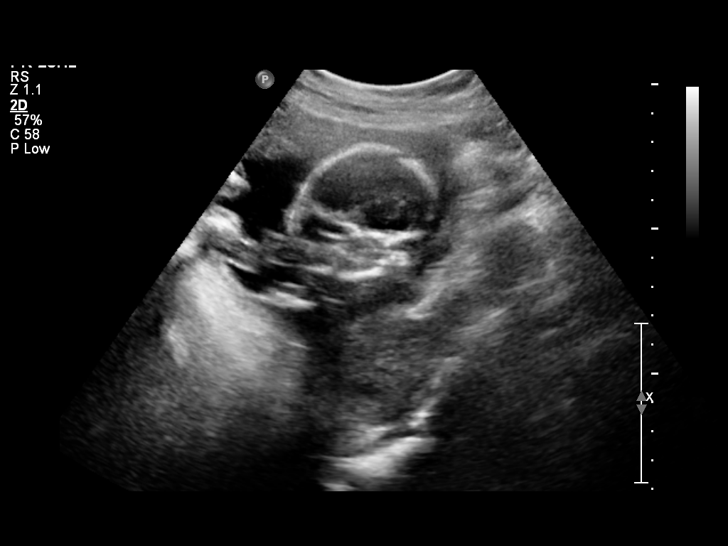
[im 8/70]
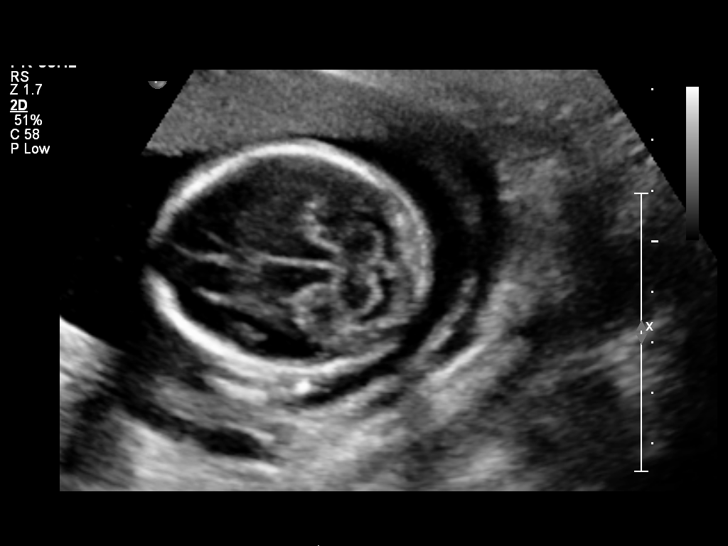
[im 13/70]
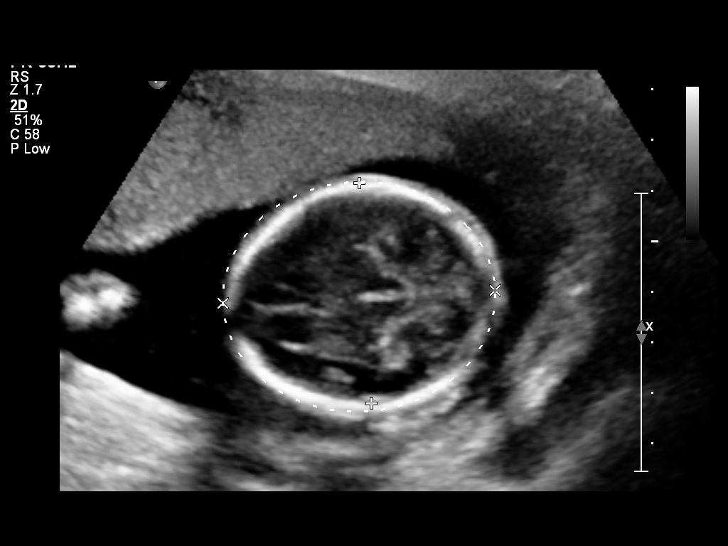
[im 21/70]
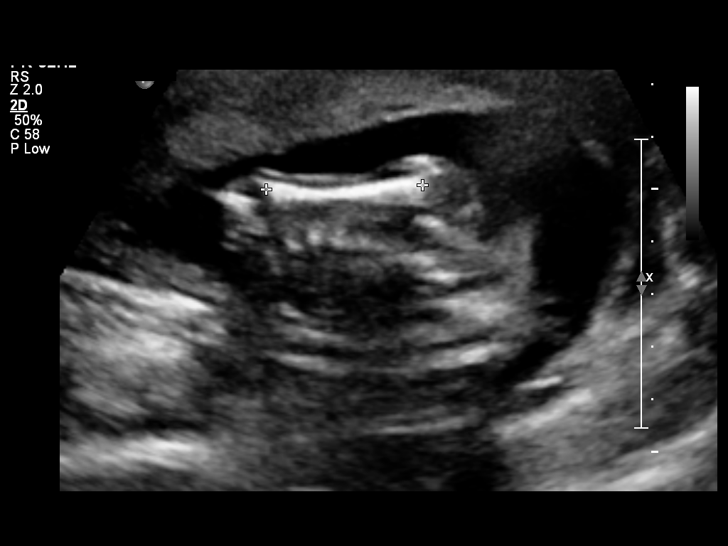
[im 26/70]
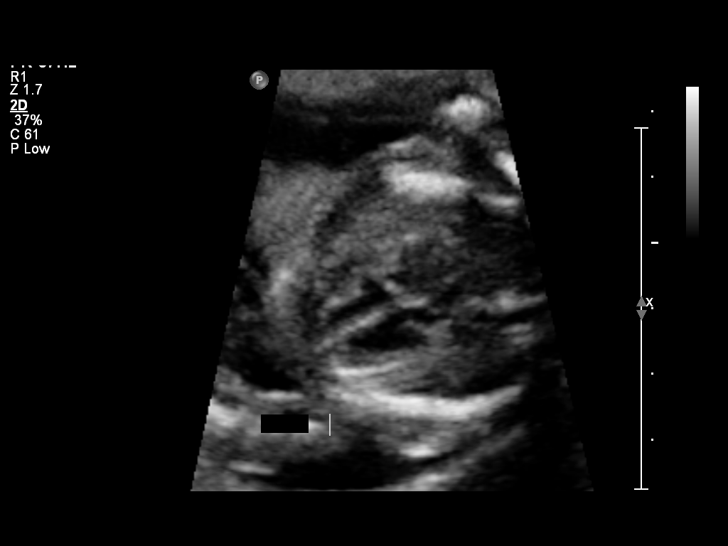
[im 31/70]
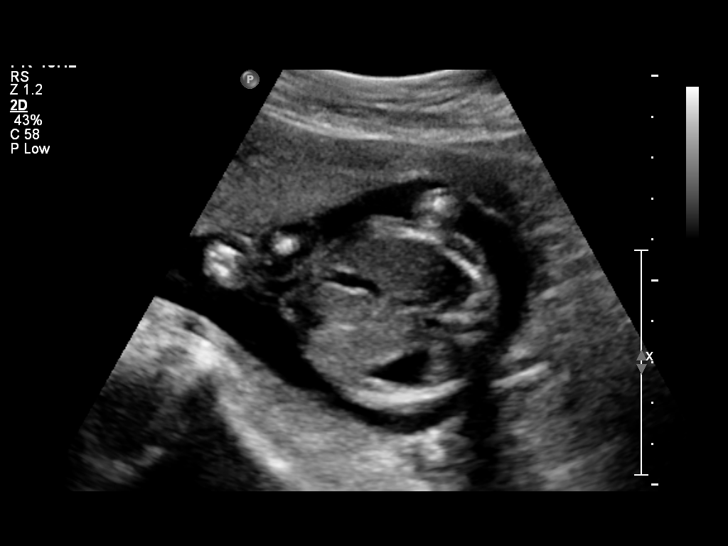
[im 39/70]
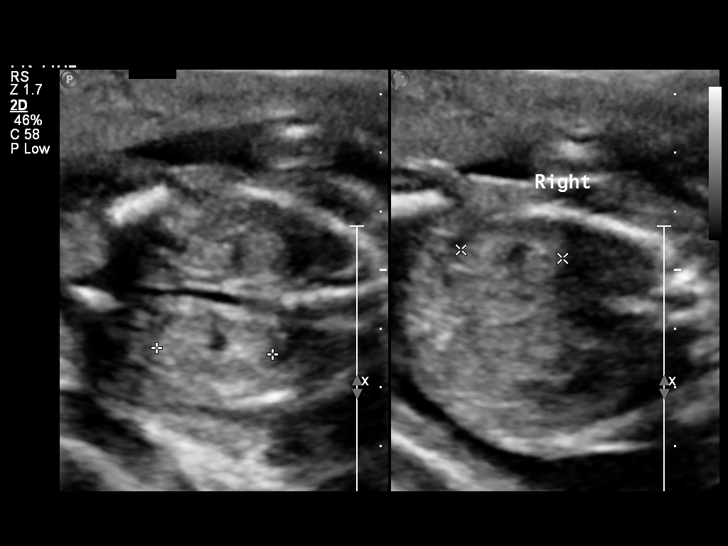
[im 44/70]
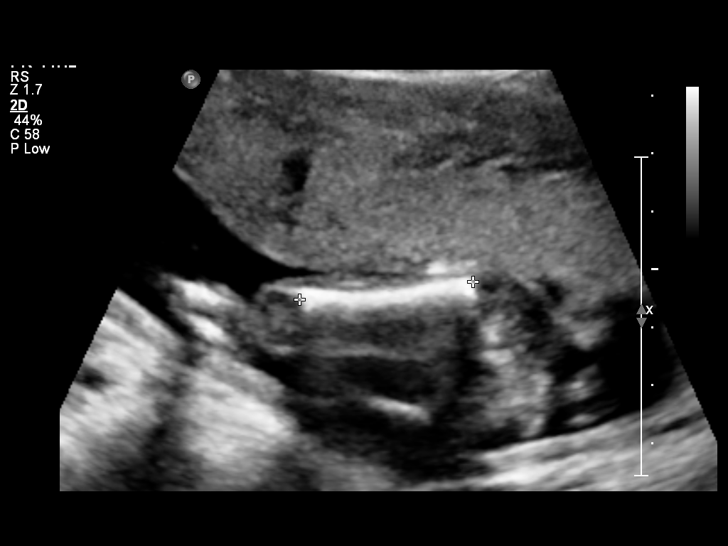
[im 49/70]
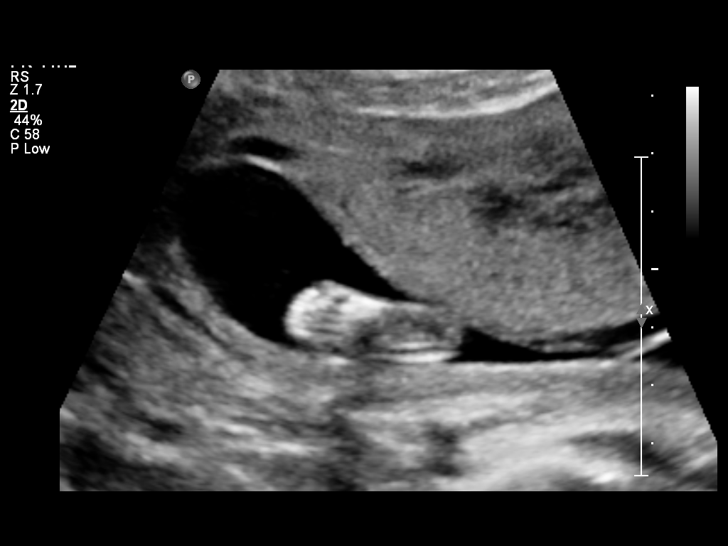
[im 57/70]
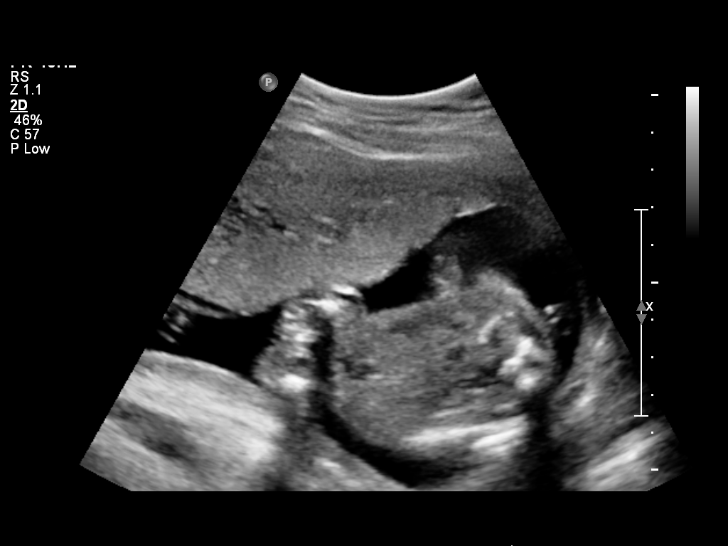
[im 62/70]
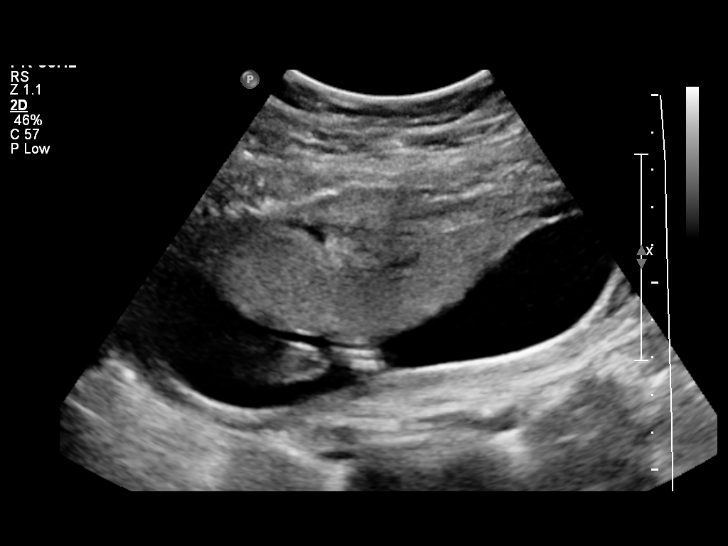
[im 67/70]
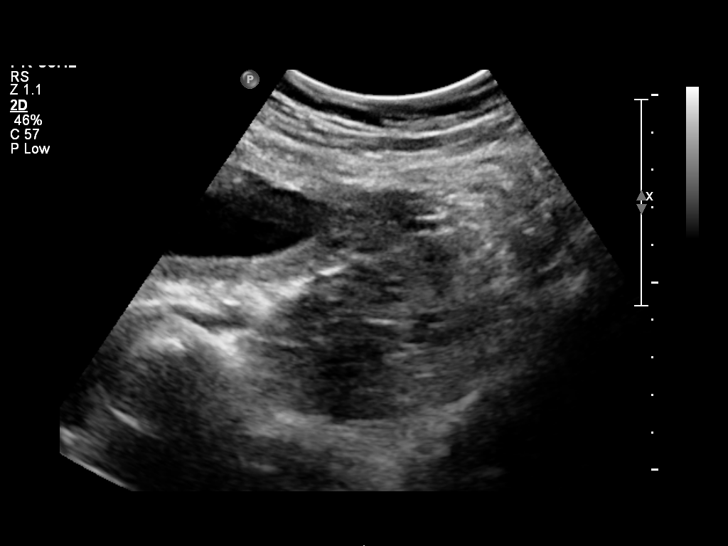

[12 of 28 positions shown; findings below may reference images not displayed]

OBSTETRICS REPORT
                      (Signed Final 03/16/2012 [DATE])

Service(s) Provided

 US OB DETAIL + 14 WK                                  76811.0
Indications

 Detailed fetal anatomic survey
Fetal Evaluation

 Num Of Fetuses:    1
 Fetal Heart Rate:  138                         bpm
 Cardiac Activity:  Observed
 Presentation:      Cephalic
 Placenta:          Anterior, above cervical os
 P. Cord            Visualized, central
 Insertion:

 Amniotic Fluid
 AFI FV:      Subjectively within normal limits
                                             Larg Pckt:     4.8  cm
Biometry

 BPD:     43.9  mm    G. Age:   19w 2d                CI:        77.99   70 - 86
                                                      FL/HC:      18.8   16.1 -

 HC:     157.3  mm    G. Age:   18w 5d       20  %    HC/AC:      1.07   1.09 -

 AC:     147.5  mm    G. Age:   20w 0d       75  %    FL/BPD:
 FL:      29.6  mm    G. Age:   19w 1d       43  %    FL/AC:      20.1   20 - 24
 HUM:     29.7  mm    G. Age:   19w 5d       66  %
 NFT:      1.4  mm

 Est. FW:     296  gm    0 lb 10 oz      52  %
Gestational Age

 LMP:           19w 1d       Date:   11/03/11                 EDD:   08/09/12
 U/S Today:     19w 2d                                        EDD:   08/08/12
 Best:          19w 1d    Det. By:   LMP  (11/03/11)          EDD:   08/09/12
2nd Trimester Genetic Sonogram - Trisomy 21 Screening

 Age:                                             26          Risk=1:   826

 Structural anomalies (inc. cardiac):             No
 Echogenic bowel:                                 No
 Hypoplastic / absent midphalanx 5th Digit:       No
 Wide space 2st-3nd toes:                         N/A
 Pyelectasis:                                     No
 2-vessel umbilical cord:                         No
 Echogenic cardiac foci:                          No
Anatomy

 Cranium:          Appears normal         Aortic Arch:      Not well visualized
 Fetal Cavum:      Appears normal         Ductal Arch:      Not well visualized
 Ventricles:       Appears normal         Diaphragm:        Appears normal
 Choroid Plexus:   Appears normal         Stomach:          Appears normal, left
                                                            sided
 Cerebellum:       Appears normal         Abdomen:          Appears normal
 Posterior Fossa:  Appears normal         Abdominal Wall:   Appears nml (cord
                                                            insert, abd wall)
 Nuchal Fold:      Appears normal         Cord Vessels:     Appears normal (3
                                                            vessel cord)
 Face:             Appears normal         Kidneys:          Appear normal
                   (orbits and profile)
 Lips:             Not well visualized    Bladder:          Appears normal
 Heart:            Appears normal         Spine:            Not well visualized
                   (4CH, axis, and
                   situs)
 RVOT:             Not well visualized    Lower             Appears normal
                                          Extremities:
 LVOT:             Appears normal         Upper             Appears normal
                                          Extremities:

 Other:  Heels and 5th digit appear normal. Fetus appears to be a female.
         Technically difficult due to fetal position.
Cervix Uterus Adnexa

 Cervical Length:   3.5       cm

 Cervix:       Normal appearance by transabdominal scan.
 Uterus:       No abnormality visualized.
 Cul De Sac:   No free fluid seen.
 Left Ovary:   Not visualized.
 Right Ovary:  Not visualized.
 Adnexa:     No abnormality visualized.
Impression

 Single living IUP with assigned GA of 19w 1d. EGA by
 ultrasound is concordant with assigned GA.
 No fetal anatomic abnormality is identified.
 The fetal RVOT, aortic arch, ductal arch, spine and lips are
 not well visualized. Technically difficult due to fetal position.
 Normal amniotic fluid volume and cervical length.
Recommendations

 Consider follow-up ultrasound in 2 to 3 weeks for hopeful
 improved visualization in fetal structures not well seen today.

## 2013-11-15 ENCOUNTER — Encounter (HOSPITAL_COMMUNITY): Payer: Self-pay | Admitting: *Deleted

## 2013-12-07 IMAGING — US US OB FOLLOW-UP
1 series · 12 of 28 positions shown · non-contrast
Comparison: none

[Series 1: us ob follow up · 49 acquisitions, 12 frames shown]
[im 2/49]
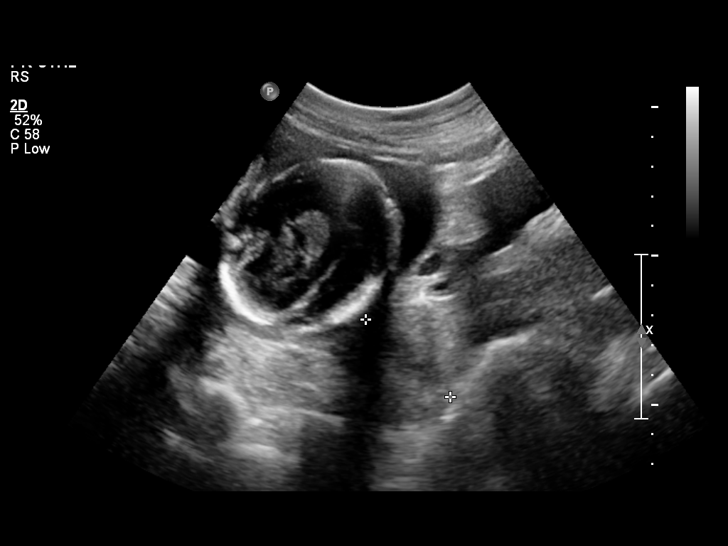
[im 6/49]
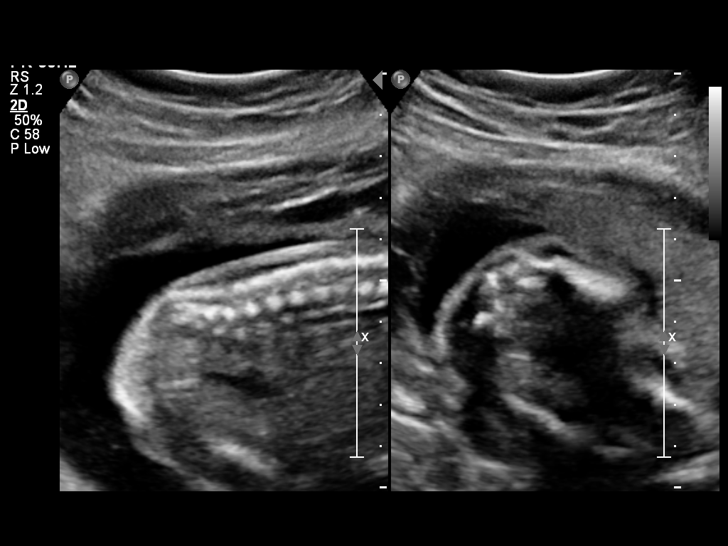
[im 9/49]
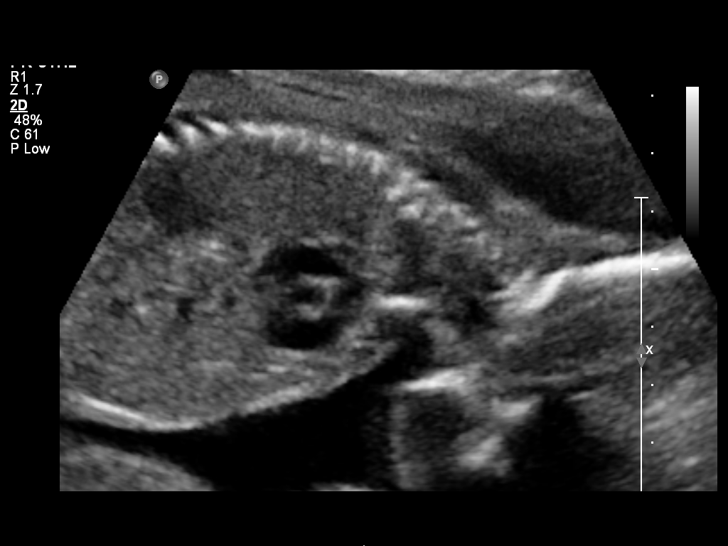
[im 15/49]
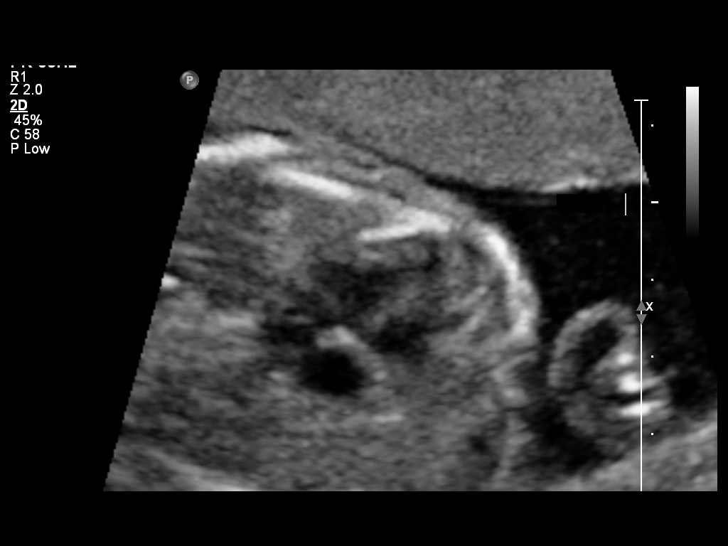
[im 18/49]
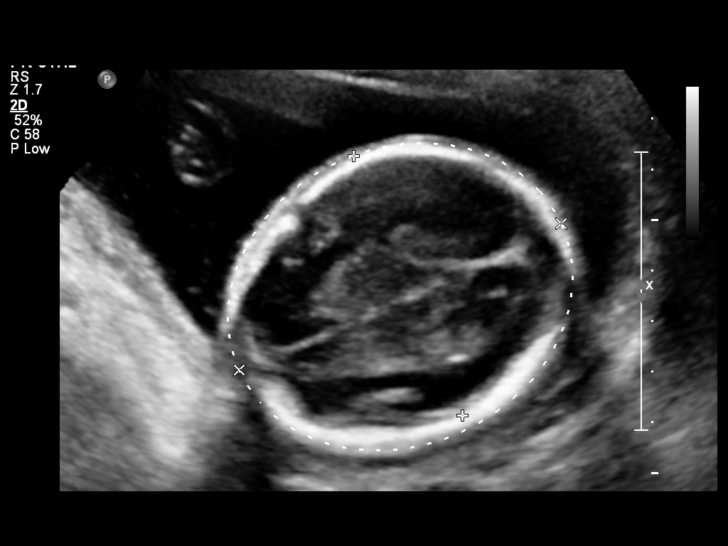
[im 22/49]
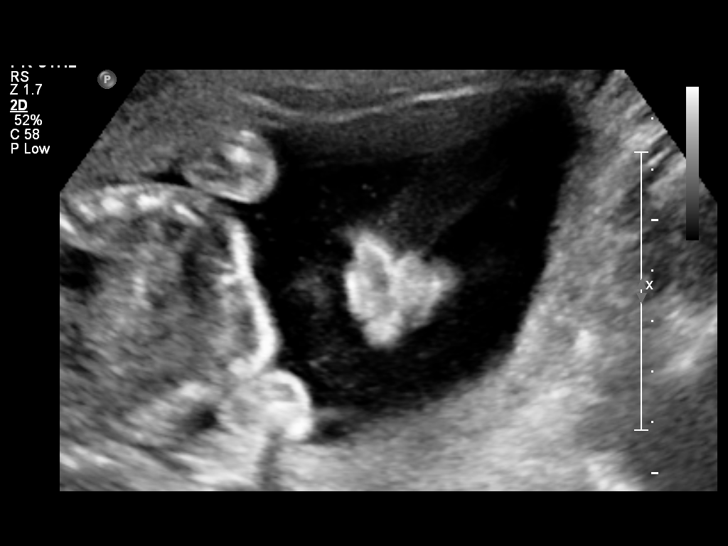
[im 27/49]
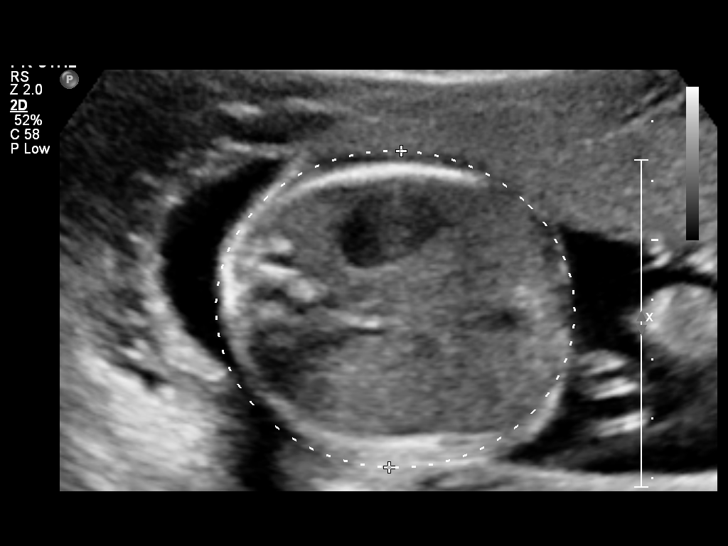
[im 31/49]
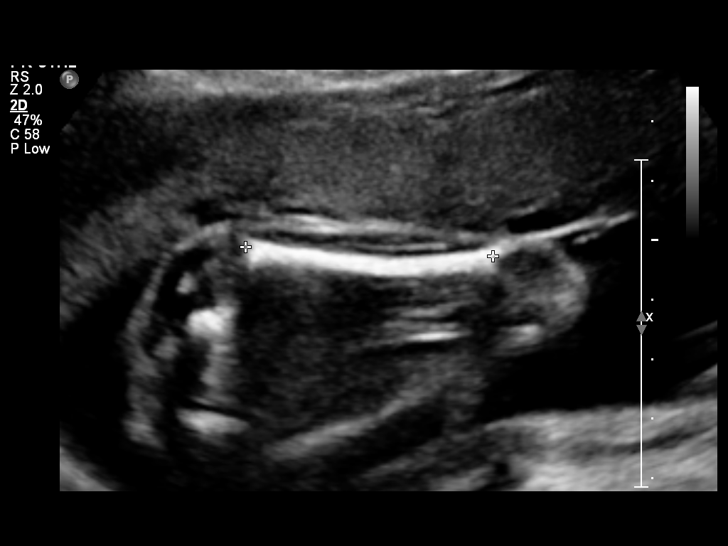
[im 34/49]
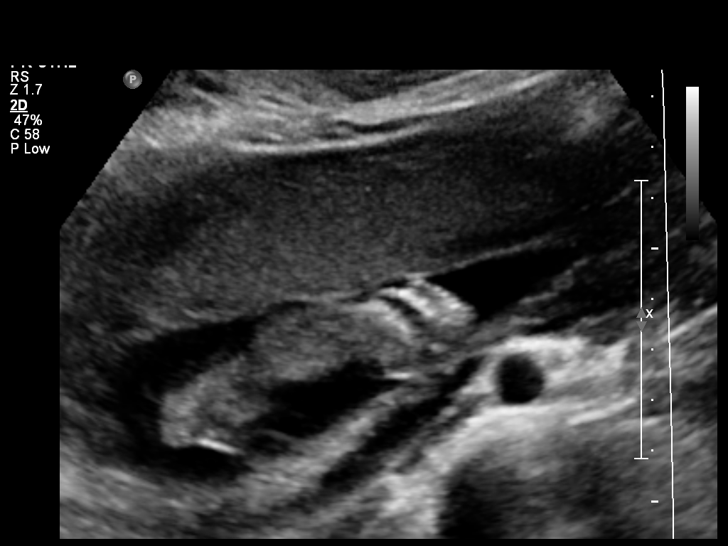
[im 40/49]
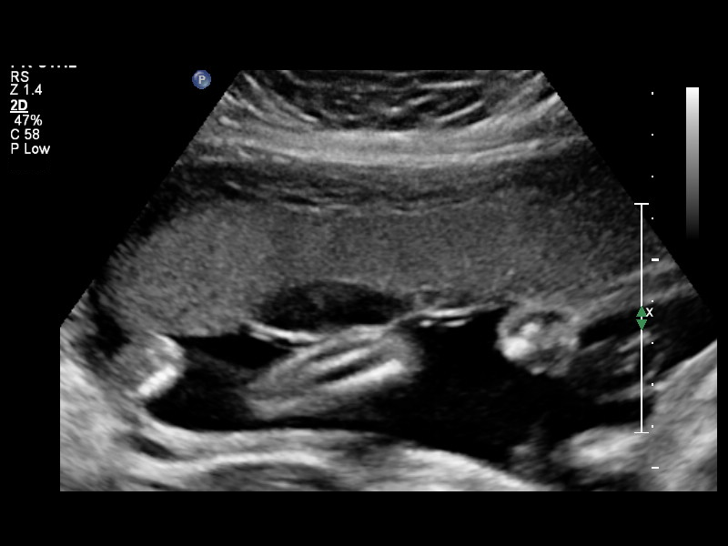
[im 43/49]
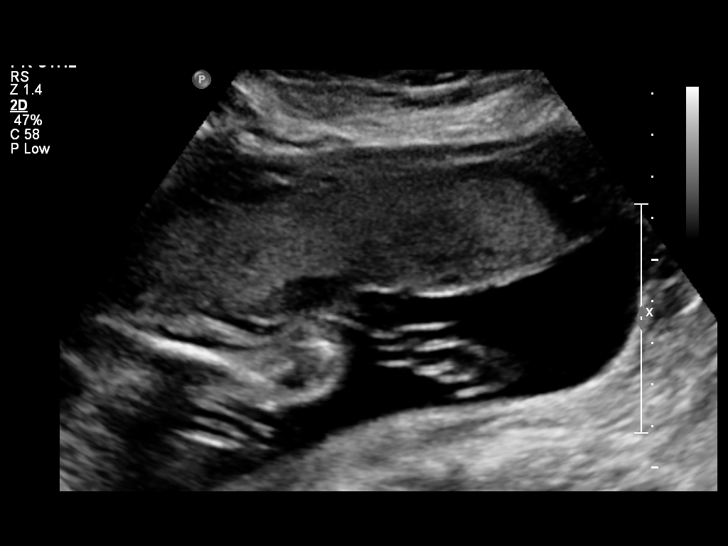
[im 47/49]
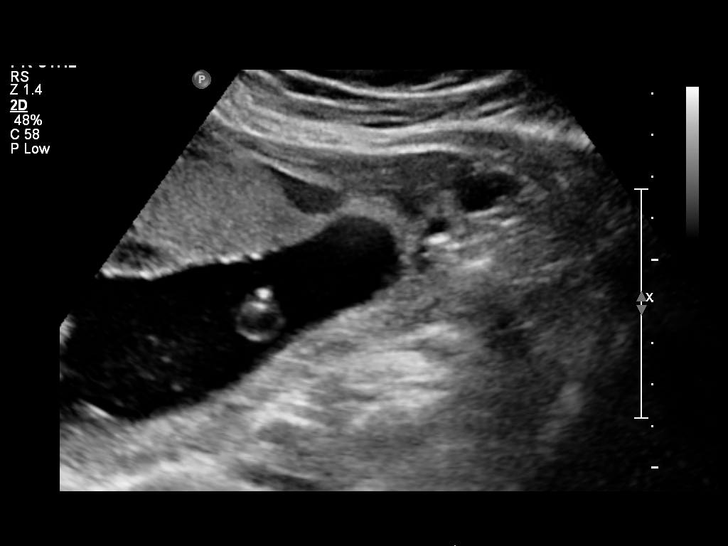

[12 of 28 positions shown; findings below may reference images not displayed]

OBSTETRICS REPORT
                      (Signed Final 04/13/2012 [DATE])

Service(s) Provided

 US OB FOLLOW UP                                       76816.1
Indications

 Follow-up incomplete fetal anatomic evaluation
Fetal Evaluation

 Num Of Fetuses:    1
 Fetal Heart Rate:  140                         bpm
 Cardiac Activity:  Observed
 Presentation:      Cephalic
 Placenta:          Anterior, above cervical os
 P. Cord            Visualized, central
 Insertion:

 Amniotic Fluid
 AFI FV:      Subjectively within normal limits
                                             Larg Pckt:     4.0  cm
Biometry

 BPD:     54.8  mm    G. Age:   22w 5d                CI:         76.3   70 - 86
                                                      FL/HC:      21.2   19.2 -

 HC:     198.8  mm    G. Age:   22w 1d        7  %    HC/AC:      1.11   1.05 -

 AC:     178.3  mm    G. Age:   22w 5d       28  %    FL/BPD:     76.8   71 - 87
 FL:      42.1  mm    G. Age:   23w 5d       58  %    FL/AC:      23.6   20 - 24
 Est. FW:     556  gm      1 lb 4 oz     52  %
Gestational Age

 LMP:           23w 1d       Date:   11/03/11                 EDD:   08/09/12
 U/S Today:     22w 6d                                        EDD:   08/11/12
 Best:          23w 1d    Det. By:   LMP  (11/03/11)          EDD:   08/09/12
Anatomy

 Cranium:          Appears normal         Aortic Arch:      Appears normal
 Fetal Cavum:      Previously seen        Ductal Arch:      Appears normal
 Ventricles:       Appears normal         Diaphragm:        Previously seen
 Choroid Plexus:   Previously seen        Stomach:          Appears normal, left
                                                            sided
 Cerebellum:       Previously seen        Abdomen:          Appears normal
 Posterior Fossa:  Previously seen        Abdominal Wall:   Previously seen
 Nuchal Fold:      Previously seen        Cord Vessels:     Previously seen
 Face:             Orbits and profile     Kidneys:          Appear normal
                   previously seen
 Lips:             Appears normal         Bladder:          Appears normal
 Heart:            Appears normal         Spine:            Appears normal
                   (4CH, axis, and
                   situs)
 RVOT:             Not well visualized    Lower             Previously seen
                                          Extremities:
 LVOT:             Appears normal         Upper             Previously seen
                                          Extremities:

 Other:  Fetus appears to be a female.
Cervix Uterus Adnexa

 Cervical Length:   3.8       cm

 Cervix:       Normal appearance by transabdominal scan.
 Uterus:       No abnormality visualized.
 Cul De Sac:   No free fluid seen.
 Left Ovary:   Not visualized.
 Right Ovary:  Not visualized.
 Adnexa:     No abnormality visualized.
Impression

 Assigned GA is currently 23w 1d.   Appropriate interval fetal
 growth.
 No fetal anomalies seen involving visualized anatomy.  Fetal
 spine, lips, and arches are visualized on today's exam.
 Normal amniotic fluid volume. Normal cervical length.

## 2016-08-03 ENCOUNTER — Encounter: Payer: Self-pay | Admitting: Family Medicine

## 2016-08-03 ENCOUNTER — Ambulatory Visit (INDEPENDENT_AMBULATORY_CARE_PROVIDER_SITE_OTHER): Payer: Self-pay | Admitting: Family Medicine

## 2016-08-03 VITALS — BP 125/78 | HR 85 | Temp 99.1°F | Resp 17 | Ht 64.5 in | Wt 185.0 lb

## 2016-08-03 DIAGNOSIS — F411 Generalized anxiety disorder: Secondary | ICD-10-CM

## 2016-08-03 DIAGNOSIS — R1013 Epigastric pain: Secondary | ICD-10-CM

## 2016-08-03 MED ORDER — SERTRALINE HCL 50 MG PO TABS: 50.0000 mg | ORAL_TABLET | Freq: Every day | ORAL | 3 refills | Status: DC

## 2016-08-03 MED ORDER — PANTOPRAZOLE SODIUM 20 MG PO TBEC: 20.0000 mg | DELAYED_RELEASE_TABLET | Freq: Every day | ORAL | 1 refills | Status: DC

## 2016-08-03 NOTE — Progress Notes (Signed)
Patient ID: Jacqueline Graves, female    DOB: 1985-03-06  Age: 31 y.o. MRN: [id number]  Chief Complaint  Patient presents with  . Abdominal Pain  . Anxiety    Subjective:   31 year old Hispanic American lady who is here with 2 problems. She's had a lot of problems with epigastric pain. She used to have in the past, then it then did well for a while, and now she's been having a lot again. She hurts in the upper part of her abdomen. It seems to be made worse with stress. She has not taken any medications for it.  She does not speak much English, but her relative interpreted for her. She is anxious a lot of the time. She does also get upset little temper when she gets too anxious. She denies major depression. She does not sleep well at night. She does not get any exercise. She has 3 children and husband. Marriage is good. She watches television. She does not have much of a spiritual component to her life. She does not smoke, drink, or use any drugs.  Current allergies, medications, problem list, past/family and social histories reviewed.  Objective:  BP 125/78   Pulse 85   Temp 99.1 F (37.3 C) (Oral)   Resp 17   Ht 5' 4.5" (1.638 m)   Wt 185 lb (83.9 kg)   SpO2 98%   BMI 31.26 kg/m   Pleasant lady, healthy appearance. Neck supple without thyromegaly. Chest clear. Heart regular without any murmurs. Himself without major masses or tenderness.  Assessment & Plan:   Assessment: 1. Dyspepsia   2. Anxiety state       Plan: See instructions. Will treat symptomatically. However she keeps having a lot of dyspepsia she might need to be sent to a gastroenterologist for an upper endoscopy.  Orders Placed This Encounter  Procedures  . CBC  . Comprehensive metabolic panel  . TSH    Meds ordered this encounter  Medications  . pantoprazole (PROTONIX) 20 MG tablet    Sig: Take 1 tablet (20 mg total) by mouth daily.    Dispense:  30 tablet    Refill:  1  . sertraline (ZOLOFT) 50  MG tablet    Sig: Take 1 tablet (50 mg total) by mouth daily.    Dispense:  30 tablet    Refill:  3         Patient Instructions   Take the pantoprazole 1 each morning for the stomach discomfort  Take the sertraline 50 mg 1 each morning for anxiety. It takes taking this medicine every day for about 2-3 weeks before you will feel like you're doing significantly better.  Try to get regular exercise as discussed  Will try to let you know the results of your labs sometime next week.  Return in about 4-6 weeks for a follow-up check if possible. You can speak to the people at the front desk and see if there is a Spanish-speaking female provider that can work with you.  Return as needed    IF you received an x-ray today, you will receive an invoice from [facility]. Please contact [facility] at [phone] with questions or concerns regarding your invoice.   IF you received labwork today, you will receive an invoice from [facility]. Please contact [facility] at [phone] with questions or concerns regarding your invoice.   Our billing staff will not be able to assist you with questions regarding bills from these companies.  You will be  contacted with the lab results as soon as they are available. The fastest way to get your results is to activate your My Chart account. Instructions are located on the last page of this paperwork. If you have not heard from us regarding the results in 2 weeks, please contact this office.         No Follow-up on file.   HOPPER,DAVID, MD 08/03/2016

## 2016-08-03 NOTE — Patient Instructions (Addendum)
Take the pantoprazole 1 each morning for the stomach discomfort  Take the sertraline 50 mg 1 each morning for anxiety. It takes taking this medicine every day for about 2-3 weeks before you will feel like you're doing significantly better.  Try to get regular exercise as discussed  Will try to let you know the results of your labs sometime next week.  Return in about 4-6 weeks for a follow-up check if possible. You can speak to the people at the front desk and see if there is a Spanish-speaking female provider that can work with you.  Return as needed    IF you received an x-ray today, you will receive an invoice from [facility]. Please contact [facility] at [phone] with questions or concerns regarding your invoice.   IF you received labwork today, you will receive an invoice from [facility]. Please contact [facility] at [phone] with questions or concerns regarding your invoice.   Our billing staff will not be able to assist you with questions regarding bills from these companies.  You will be contacted with the lab results as soon as they are available. The fastest way to get your results is to activate your My Chart account. Instructions are located on the last page of this paperwork. If you have not heard from us regarding the results in 2 weeks, please contact this office.

## 2016-08-04 LAB — COMPREHENSIVE METABOLIC PANEL
A/G RATIO: 1.8 (ref 1.2–2.2)
ALT: 14 IU/L (ref 0–32)
AST: 20 IU/L (ref 0–40)
Albumin: 4.8 g/dL (ref 3.5–5.5)
Alkaline Phosphatase: 74 IU/L (ref 39–117)
BUN/Creatinine Ratio: 10 (ref 9–23)
BUN: 9 mg/dL (ref 6–20)
Bilirubin Total: 0.2 mg/dL (ref 0.0–1.2)
CHLORIDE: 104 mmol/L (ref 96–106)
CO2: 22 mmol/L (ref 20–29)
Calcium: 9.8 mg/dL (ref 8.7–10.2)
Creatinine, Ser: 0.93 mg/dL (ref 0.57–1.00)
GFR calc non Af Amer: 83 mL/min/{1.73_m2} (ref >59)
GFR, EST AFRICAN AMERICAN: 95 mL/min/{1.73_m2} (ref >59)
GLUCOSE: 90 mg/dL (ref 65–99)
Globulin, Total: 2.7 g/dL (ref 1.5–4.5)
POTASSIUM: 4.1 mmol/L (ref 3.5–5.2)
Sodium: 142 mmol/L (ref 134–144)
TOTAL PROTEIN: 7.5 g/dL (ref 6.0–8.5)

## 2016-08-04 LAB — CBC
Hematocrit: 39.8 % (ref 34.0–46.6)
Hemoglobin: 13.1 g/dL (ref 11.1–15.9)
MCH: 28.7 pg (ref 26.6–33.0)
MCHC: 32.9 g/dL (ref 31.5–35.7)
MCV: 87 fL (ref 79–97)
PLATELETS: 444 10*3/uL — AB (ref 150–379)
RBC: 4.57 x10E6/uL (ref 3.77–5.28)
RDW: 14.3 % (ref 12.3–15.4)
WBC: 9.2 10*3/uL (ref 3.4–10.8)

## 2016-08-04 LAB — TSH: TSH: 1.2 u[IU]/mL (ref 0.450–4.500)

## 2016-10-25 ENCOUNTER — Ambulatory Visit: Payer: Self-pay | Admitting: Emergency Medicine

## 2016-10-30 ENCOUNTER — Ambulatory Visit (INDEPENDENT_AMBULATORY_CARE_PROVIDER_SITE_OTHER): Payer: Self-pay | Admitting: Emergency Medicine

## 2016-10-30 ENCOUNTER — Encounter: Payer: Self-pay | Admitting: Emergency Medicine

## 2016-10-30 VITALS — BP 107/67 | HR 81 | Temp 99.1°F | Resp 16 | Ht 63.0 in | Wt 189.0 lb

## 2016-10-30 DIAGNOSIS — R42 Dizziness and giddiness: Secondary | ICD-10-CM

## 2016-10-30 LAB — COMPREHENSIVE METABOLIC PANEL
ALK PHOS: 64 IU/L (ref 39–117)
ALT: 11 IU/L (ref 0–32)
AST: 20 IU/L (ref 0–40)
Albumin/Globulin Ratio: 1.8 (ref 1.2–2.2)
Albumin: 4.6 g/dL (ref 3.5–5.5)
BILIRUBIN TOTAL: 0.3 mg/dL (ref 0.0–1.2)
BUN/Creatinine Ratio: 15 (ref 9–23)
BUN: 11 mg/dL (ref 6–20)
CHLORIDE: 107 mmol/L — AB (ref 96–106)
CO2: 23 mmol/L (ref 20–29)
Calcium: 9.5 mg/dL (ref 8.7–10.2)
Creatinine, Ser: 0.73 mg/dL (ref 0.57–1.00)
GFR calc Af Amer: 128 mL/min/{1.73_m2} (ref >59)
GFR calc non Af Amer: 111 mL/min/{1.73_m2} (ref >59)
Globulin, Total: 2.5 g/dL (ref 1.5–4.5)
Glucose: 95 mg/dL (ref 65–99)
Potassium: 4.4 mmol/L (ref 3.5–5.2)
SODIUM: 141 mmol/L (ref 134–144)
Total Protein: 7.1 g/dL (ref 6.0–8.5)

## 2016-10-30 LAB — POCT URINALYSIS DIP (MANUAL ENTRY)
Bilirubin, UA: NEGATIVE
Glucose, UA: NEGATIVE mg/dL
Ketones, POC UA: NEGATIVE mg/dL
Leukocytes, UA: NEGATIVE
NITRITE UA: NEGATIVE
PH UA: 5.5 (ref 5.0–8.0)
Protein Ur, POC: NEGATIVE mg/dL
SPEC GRAV UA: 1.025 (ref 1.010–1.025)
UROBILINOGEN UA: 0.2 U/dL

## 2016-10-30 LAB — CBC WITH DIFFERENTIAL/PLATELET
BASOS: 0 %
Basophils Absolute: 0 10*3/uL (ref 0.0–0.2)
EOS (ABSOLUTE): 0.1 10*3/uL (ref 0.0–0.4)
Eos: 1 %
Hematocrit: 38.6 % (ref 34.0–46.6)
Hemoglobin: 12.3 g/dL (ref 11.1–15.9)
IMMATURE GRANULOCYTES: 1 %
Immature Grans (Abs): 0 10*3/uL (ref 0.0–0.1)
LYMPHS ABS: 1.6 10*3/uL (ref 0.7–3.1)
Lymphs: 22 %
MCH: 27.9 pg (ref 26.6–33.0)
MCHC: 31.9 g/dL (ref 31.5–35.7)
MCV: 88 fL (ref 79–97)
MONOS ABS: 0.6 10*3/uL (ref 0.1–0.9)
Monocytes: 8 %
NEUTROS ABS: 5.2 10*3/uL (ref 1.4–7.0)
NEUTROS PCT: 68 %
PLATELETS: 362 10*3/uL (ref 150–379)
RBC: 4.41 x10E6/uL (ref 3.77–5.28)
RDW: 14.1 % (ref 12.3–15.4)
WBC: 7.6 10*3/uL (ref 3.4–10.8)

## 2016-10-30 NOTE — Patient Instructions (Addendum)
   IF you received an x-ray today, you will receive an invoice from [facility]. Please contact  Radiology at [phone] with questions or concerns regarding your invoice.   IF you received labwork today, you will receive an invoice from [facility]. Please contact [facility] at [phone] with questions or concerns regarding your invoice.   Our billing staff will not be able to assist you with questions regarding bills from these companies.  You will be contacted with the lab results as soon as they are available. The fastest way to get your results is to activate your My Chart account. Instructions are located on the last page of this paperwork. If you have not heard from us regarding the results in 2 weeks, please contact this office.     Mareos (Dizziness) Los mareos son un problema muy frecuente. Causan sensacin de inestabilidad o de desvanecimiento. Puede sentir que se va a desmayar. Un mareo puede provocarle una lesin si se tropieza o se cae. Cualquier persona puede marearse, pero los mareos son ms frecuentes en los adultos mayores. Esta afeccin puede tener muchas causas, por ejemplo:  Medicamentos.  Deshidratacin.  Enfermedad. CUIDADOS EN EL HOGAR Estas indicaciones pueden ayudarlo con el trastorno: Comida y bebida  Beba suficiente lquido para mantener el pis (orina) claro o de color amarillo plido. Esto evita la deshidratacin. Trate de beber ms lquidos transparentes, como agua.  No beba alcohol.  Limite la cantidad de cafena que bebe o come si el mdico se lo indic.  Limite la cantidad de sal que bebe o come si el mdico se lo indic. Actividad  Evite los movimientos rpidos. ? Cuando se levante de una silla, sujtese hasta sentirse bien. ? Por la maana, sintese primero a un lado de la cama. Cuando se sienta bien, pngase lentamente de pie mientras se sostiene de algo, hasta que sepa que ha logrado el equilibrio.  Mueva las piernas con  frecuencia si debe estar de pie en un lugar durante mucho tiempo. Mientras est de pie, contraiga y relaje los msculos de las piernas.  No conduzca vehculos ni utilice maquinarias pesadas si se siente mareado.  Evite agacharse si se siente mareado. En su casa, coloque los objetos de modo que le resulte fcil alcanzarlos sin agacharse. Estilo de vida  No consuma ningn producto que contenga tabaco, lo que incluye cigarrillos, tabaco de mascar o cigarrillos electrnicos. Si necesita ayuda para dejar de fumar, consulte al mdico.  Trate de reducir el nivel de estrs practicando actividades como el yoga o la meditacin. Hable con el mdico si necesita ayuda. Instrucciones generales  Controle sus mareos para ver si hay cambios.  Tome los medicamentos solamente como se lo haya indicado el mdico. Hable con el mdico si cree que algn medicamento que est tomando es la causa de sus mareos.  Infrmele a un amigo o a un familiar si se siente mareado. Pdale a esta persona que llame al mdico si observa cambios en su comportamiento.  Concurra a todas las visitas de control como se lo haya indicado el mdico. Esto es importante. SOLICITE AYUDA SI:  Los mareos persisten.  Los mareos o la sensacin de desvanecimiento empeoran.  Siente malestar estomacal (nuseas).  Tiene problemas para escuchar.  Aparecen nuevos sntomas.  Cuando est de pie se siente inestable o que la habitacin da vueltas.  SOLICITE AYUDA DE INMEDIATO SI:  Vomita o tiene diarrea y no puede comer ni beber nada.  Tiene dificultad para lo siguiente: ? Hablar. ?   Caminar. ? Tragar. ? Usar los brazos, las manos o las piernas.  Siente una debilidad generalizada.  No piensa con claridad o tiene dificultades para armar oraciones. Es posible que un amigo o un familiar adviertan que esto ocurre.  Tiene los siguientes sntomas: ? Dolor en el pecho. ? Dolor en el vientre (abdomen). ? Falta de  aire. ? Sudoracin.  Cambios en la visin.  Hemorragias.  Dolores de cabeza.  Dolor o rigidez en el cuello.  Fiebre.  Esta informacin no tiene como fin reemplazar el consejo del mdico. Asegrese de hacerle al mdico cualquier pregunta que tenga. Document Released: 12/20/2010 Document Revised: 05/17/2014 Document Reviewed: 12/27/2013 Elsevier Interactive Patient Education  2017 [employer]  

## 2016-10-30 NOTE — Progress Notes (Signed)
Jacqueline Graves 31 y.o.   Chief Complaint  Patient presents with  . Headache    WITH DIZZINESS x 1 week    HISTORY OF PRESENT ILLNESS: This is a 31 y.o. female complaining of short live episodes of dizziness x 2 weeks; no lifestyle changes; episodes last about 10 seconds, happen randomly throughout the day, and not associated with any other significant symptoms.  HPI   Prior to Admission medications   Medication Sig Start Date End Date Taking? Authorizing Provider  acetaminophen (TYLENOL) 500 MG tablet Take 1 tablet (500 mg total) by mouth every 6 (six) hours as needed for pain. 12/02/11  Yes Jacqueline Racer, MD  ibuprofen (ADVIL,MOTRIN) 600 MG tablet Take 1 tablet (600 mg total) by mouth every 6 (six) hours as needed for pain. 08/07/12  Yes Jacqueline Balsam, MD  Prenatal Vit-Fe Fumarate-FA (PRENATAL MULTIVITAMIN) TABS Take 1 tablet by mouth daily at 12 noon.   Yes [provider]  pantoprazole (PROTONIX) 20 MG tablet Take 1 tablet (20 mg total) by mouth daily. Patient not taking: Reported on 10/30/2016 08/03/16   Jacqueline Najjar, MD  sertraline (ZOLOFT) 50 MG tablet Take 1 tablet (50 mg total) by mouth daily. Patient not taking: Reported on 10/30/2016 08/03/16   Jacqueline Najjar, MD    No Known Allergies  Patient Active Problem List   Diagnosis Date Noted  . Language barrier, cultural differences 08/08/2012  . SVD (spontaneous vaginal delivery) 08/07/2012  . Supervision of normal intrauterine pregnancy in multigravida 03/02/2012    Past Medical History:  Diagnosis Date  . Medical history non-contributory     No past surgical history on file.  Social History   Social History  . Marital status: Single    Spouse name: N/A  . Number of children: N/A  . Years of education: N/A   Occupational History  . Not on file.   Social History Main Topics  . Smoking status: Never Smoker  . Smokeless tobacco: Never Used  . Alcohol use No  . Drug use: No  . Sexual  activity: Yes   Other Topics Concern  . Not on file   Social History Narrative  . No narrative on file    Family History  Problem Relation Age of Onset  . Hypertension Father   . Hearing loss Neg Hx      Review of Systems  Constitutional: Negative.  Negative for chills, fever and malaise/fatigue.  HENT: Negative.   Eyes: Negative.   Respiratory: Negative.  Negative for cough, hemoptysis and shortness of breath.   Cardiovascular: Negative.  Negative for chest pain and palpitations.  Gastrointestinal: Negative for abdominal pain, blood in stool, diarrhea, melena, nausea and vomiting.  Genitourinary: Negative.  Negative for dysuria and hematuria.  Musculoskeletal: Negative for back pain, myalgias and neck pain.  Skin: Negative.  Negative for rash.  Neurological: Positive for dizziness. Negative for sensory change, focal weakness and headaches.  Endo/Heme/Allergies: Negative.   All other systems reviewed and are negative.  Vitals:   10/30/16 0954  BP: 107/67  Pulse: 81  Resp: 16  Temp: 99.1 F (37.3 C)  SpO2: 98%     Physical Exam  Constitutional: She is oriented to person, place, and time. She appears well-developed and well-nourished.  HENT:  Head: Normocephalic and atraumatic.  Right Ear: External ear normal.  Left Ear: External ear normal.  Nose: Nose normal.  Mouth/Throat: Oropharynx is clear and moist.  Eyes: Pupils are equal, round, and reactive to light.  Conjunctivae and EOM are normal.  Neck: Normal range of motion. Neck supple. No JVD present. No thyromegaly present.  Cardiovascular: Normal rate, regular rhythm, normal heart sounds and intact distal pulses.   Pulmonary/Chest: Effort normal and breath sounds normal.  Abdominal: Soft. Bowel sounds are normal.  Musculoskeletal: Normal range of motion.  Lymphadenopathy:    She has no cervical adenopathy.  Neurological: She is alert and oriented to person, place, and time. She displays normal reflexes. No  cranial nerve deficit or sensory deficit. She exhibits normal muscle tone. Coordination normal.  Skin: Skin is warm and dry. Capillary refill takes less than 2 seconds. No rash noted.  Psychiatric: She has a normal mood and affect. Her behavior is normal.  Vitals reviewed.   Pertinent labs & imaging results that were available during my care of the patient were reviewed by me and considered in my medical decision making (see chart for details).  Results for orders placed or performed in visit on 10/30/16 (from the past 24 hour(s))  POCT urinalysis dipstick     Status: Abnormal   Collection Time: 10/30/16 10:45 AM  Result Value Ref Range   Color, UA yellow yellow   Clarity, UA clear clear   Glucose, UA negative negative mg/dL   Bilirubin, UA negative negative   Ketones, POC UA negative negative mg/dL   Spec Grav, UA [id number] [id number] - 1.025   Blood, UA moderate (A) negative   pH, UA 5.5 5.0 - 8.0   Protein Ur, POC negative negative mg/dL   Urobilinogen, UA 0.2 0.2 or 1.0 E.U./dL   Nitrite, UA Negative Negative   Leukocytes, UA Negative Negative    ASSESSMENT & PLAN: Jacqueline Graves was seen today for headache.  Diagnoses and all orders for this visit:  Dizziness -     CBC with Differential/Platelet -     Comprehensive metabolic panel -     POCT urinalysis dipstick   Will check lab results, monitor symptoms, and follow up in 1-2 weeks if no better.   Patient Instructions       IF you received an x-ray today, you will receive an invoice from [facility]. Please contact Jacqueline Graves at [phone] with questions or concerns regarding your invoice.   IF you received labwork today, you will receive an invoice from Jacqueline Graves. Please contact [facility] at [phone] with questions or concerns regarding your invoice.   Our billing staff will not be able to assist you with questions regarding bills from these companies.  You will be contacted with the lab results as soon  as they are available. The fastest way to get your results is to activate your My Chart account. Instructions are located on the last page of this paperwork. If you have not heard from us regarding the results in 2 weeks, please contact this office.     Mareos (Dizziness) Los mareos son un problema muy frecuente. Causan sensacin de inestabilidad o de desvanecimiento. Puede sentir que se va a desmayar. Un mareo puede provocarle una lesin si se tropieza o se cae. Cualquier persona puede marearse, pero los Eppingmareos son ms frecuentes en los ONEOKadultos mayores. Esta afeccin puede tener muchas causas, por ejemplo:  Medicamentos.  Deshidratacin.  Enfermedad. CUIDADOS EN EL HOGAR Estas indicaciones pueden ayudarlo con el trastorno: Comida y bebida  Beba suficiente lquido para Pharmacologistmantener el pis (orina) claro o de color amarillo plido. Esto evita la deshidratacin. Trate de beber ms lquidos transparentes, como agua.  No beba alcohol.  Limite la  cantidad de cafena que bebe o come si el mdico se lo indic.  Limite la cantidad de sal que bebe o come si el mdico se lo indic. Actividad  Evite los movimientos rpidos. ? Cuando se levante de una silla, sujtese hasta sentirse bien. ? Por la maana, sintese primero a un lado de la cama. Cuando se sienta bien, pngase lentamente de [address] se sostiene de algo, hasta que sepa que ha logrado el equilibrio.  Mueva las piernas con frecuencia si debe estar de pie en un lugar durante mucho tiempo. Mientras est de pie, contraiga y relaje los msculos de las piernas.  No conduzca vehculos ni utilice maquinarias pesadas si se siente mareado.  Evite agacharse si se siente mareado. En su casa, coloque los objetos de modo que le resulte fcil alcanzarlos sin Public librarian. Estilo de vida  No consuma ningn producto que contenga tabaco, lo que incluye cigarrillos, tabaco de Theatre manager o Administrator, Civil Service. Si necesita ayuda para dejar de fumar,  consulte al [employer].  Trate de reducir el nivel de estrs practicando actividades como el yoga o la meditacin. Hable con el mdico si necesita ayuda. Instrucciones generales  Controle sus mareos para ver si hay cambios.  Tome los medicamentos solamente como se lo haya indicado el mdico. Hable con el mdico si cree que algn medicamento que est tomando es la causa de sus [place].  Infrmele a un amigo o a un familiar si se siente mareado. Pdale a esta persona que llame al mdico si observa cambios en su comportamiento.  Concurra a todas las visitas de control como se lo haya indicado el mdico. Esto es importante. SOLICITE AYUDA SI:  Los [employer].  Los [employer] o la sensacin de Production assistant, radio.  Siente malestar estomacal (nuseas).  Tiene problemas para escuchar.  Aparecen nuevos sntomas.  Cuando est de pie se siente inestable o que la habitacin da vueltas.  SOLICITE AYUDA DE INMEDIATO SI:  Vomita o tiene diarrea y no puede comer ni beber nada.  Tiene dificultad para lo siguiente: ? Hablar. ? Caminar. ? Tragar. ? Usar los brazos, las manos o las piernas.  Siente una debilidad generalizada.  No piensa con claridad o tiene dificultades para armar oraciones. Es posible que un amigo o un familiar adviertan que esto ocurre.  Tiene los siguientes sntomas: ? Journalist, newspaper. ? Dolor en el vientre (abdomen). ? Falta de aire. ? Sudoracin.  Cambios en la visin.  Hemorragias.  Dolores de [place].  Dolor o rigidez en el cuello.  [place]uts.  Esta informacin no tiene Theme park manager el consejo del mdico. Asegrese de hacerle al mdico cualquier pregunta que tenga. Document Released: 12/20/2010 Document Revised: 05/17/2014 Document Reviewed: 12/27/2013 Elsevier Interactive Patient Education  2017 Elsevier Inc.      Edwina Barth, MD [facility]

## 2016-11-01 ENCOUNTER — Encounter: Payer: Self-pay | Admitting: Radiology

## 2018-10-15 ENCOUNTER — Encounter: Payer: Self-pay | Admitting: Emergency Medicine

## 2018-10-15 ENCOUNTER — Other Ambulatory Visit: Payer: Self-pay

## 2018-10-15 ENCOUNTER — Ambulatory Visit (INDEPENDENT_AMBULATORY_CARE_PROVIDER_SITE_OTHER): Payer: Self-pay | Admitting: Emergency Medicine

## 2018-10-15 VITALS — BP 119/72 | HR 83 | Temp 99.2°F | Resp 16 | Ht 63.0 in | Wt 178.4 lb

## 2018-10-15 DIAGNOSIS — N644 Mastodynia: Secondary | ICD-10-CM

## 2018-10-15 NOTE — Progress Notes (Signed)
Jacqueline Graves 33 y.o.   Chief Complaint  Patient presents with  . Breast Pain    LEFT x 3 days, no nipple drainage    HISTORY OF PRESENT ILLNESS: This is a 33 y.o. female complaining of pain on her left breast for the past 3 days.  Denies injury.  No nipple issues or discharge.  No prior medical history.  Has not had breast mammogram or sonogram ever..  No other associated symptoms.  HPI   Prior to Admission medications   Medication Sig Start Date End Date Taking? Authorizing Provider  acetaminophen (TYLENOL) 500 MG tablet Take 1 tablet (500 mg total) by mouth every 6 (six) hours as needed for pain. 12/02/11  Yes Loren Racer, MD  ibuprofen (ADVIL,MOTRIN) 600 MG tablet Take 1 tablet (600 mg total) by mouth every 6 (six) hours as needed for pain. 08/07/12  Yes Minta Balsam, MD  pantoprazole (PROTONIX) 20 MG tablet Take 1 tablet (20 mg total) by mouth daily. 08/03/16  Yes Peyton Najjar, MD  Prenatal Vit-Fe Fumarate-FA (PRENATAL MULTIVITAMIN) TABS Take 1 tablet by mouth daily at 12 noon.    [provider]  sertraline (ZOLOFT) 50 MG tablet Take 1 tablet (50 mg total) by mouth daily. Patient not taking: Reported on 10/30/2016 08/03/16   Peyton Najjar, MD    No Known Allergies  There are no active problems to display for this patient.   Past Medical History:  Diagnosis Date  . Medical history non-contributory     History reviewed. No pertinent surgical history.  Social History   Socioeconomic History  . Marital status: Single    Spouse name: Not on file  . Number of children: Not on file  . Years of education: Not on file  . Highest education level: Not on file  Occupational History  . Not on file  Social Needs  . Financial resource strain: Not on file  . Food insecurity    Worry: Not on file    Inability: Not on file  . Transportation needs    Medical: Not on file    Non-medical: Not on file  Tobacco Use  . Smoking status: Never Smoker  .  Smokeless tobacco: Never Used  Substance and Sexual Activity  . Alcohol use: No  . Drug use: No  . Sexual activity: Yes  Lifestyle  . Physical activity    Days per week: Not on file    Minutes per session: Not on file  . Stress: Not on file  Relationships  . Social Musician on phone: Not on file    Gets together: Not on file    Attends religious service: Not on file    Active member of club or organization: Not on file    Attends meetings of clubs or organizations: Not on file    Relationship status: Not on file  . Intimate partner violence    Fear of current or ex partner: Not on file    Emotionally abused: Not on file    Physically abused: Not on file    Forced sexual activity: Not on file  Other Topics Concern  . Not on file  Social History Narrative  . Not on file    Family History  Problem Relation Age of Onset  . Hypertension Father   . Hearing loss Neg Hx      Review of Systems  Constitutional: Negative.  Negative for chills and fever.  HENT: Negative.  Negative  for congestion and sore throat.   Respiratory: Negative.  Negative for cough and shortness of breath.   Cardiovascular: Negative.  Negative for chest pain and palpitations.  Gastrointestinal: Negative.  Negative for abdominal pain, diarrhea, nausea and vomiting.  Musculoskeletal: Negative.   Skin: Negative.  Negative for rash.  Neurological: Negative for dizziness and headaches.  All other systems reviewed and are negative.  Today's Vitals   10/15/18 1448  BP: 119/72  Pulse: 83  Resp: 16  Temp: 99.2 F (37.3 C)  TempSrc: Oral  SpO2: 97%  Weight: 178 lb 6.4 oz (80.9 kg)  Height: 5\' 3"  (1.6 m)   Body mass index is 31.6 kg/m.   Physical Exam Vitals signs reviewed. Exam conducted with a chaperone present.  Constitutional:      Appearance: Normal appearance.  HENT:     Head: Normocephalic.  Eyes:     Extraocular Movements: Extraocular movements intact.     Pupils: Pupils are  equal, round, and reactive to light.  Neck:     Musculoskeletal: Normal range of motion.  Cardiovascular:     Rate and Rhythm: Normal rate and regular rhythm.     Heart sounds: Normal heart sounds.  Pulmonary:     Effort: Pulmonary effort is normal.     Breath sounds: Normal breath sounds.  Chest:     Chest wall: No mass, swelling or tenderness.     Breasts:        Right: Normal. No swelling, inverted nipple, mass, nipple discharge, skin change or tenderness.        Left: Normal. No swelling, inverted nipple, mass, nipple discharge, skin change or tenderness.  Lymphadenopathy:     Upper Body:     Right upper body: No supraclavicular, axillary or pectoral adenopathy.     Left upper body: No supraclavicular, axillary or pectoral adenopathy.  Skin:    General: Skin is warm.     Capillary Refill: Capillary refill takes less than 2 seconds.  Neurological:     General: No focal deficit present.     Mental Status: She is alert and oriented to person, place, and time.  Psychiatric:        Mood and Affect: Mood normal.        Behavior: Behavior normal.      ASSESSMENT & PLAN: Jacqueline Graves was seen today for breast pain.  Diagnoses and all orders for this visit:  Breast pain, left -     MM Digital Screening; Future -     US Unlisted Procedure Breast; Future    Patient Instructions       If you have lab work done today you will be contacted with your lab results within the next 2 weeks.  If you have not heard from us then please contact us. The fastest way to get your results is to register for My Chart.   IF you received an x-ray today, you will receive an invoice from [facility]. Please contact [facility] at [phone] with questions or concerns regarding your invoice.   IF you received labwork today, you will receive an invoice from [facility]. Please contact [facility] at [phone] with questions or concerns regarding your invoice.   Our billing staff  will not be able to assist you with questions regarding bills from these companies.  You will be contacted with the lab results as soon as they are available. The fastest way to get your results is to activate your My Chart account. Instructions are located  on the last page of this paperwork. If you have not heard from [place] regarding the results in 2 weeks, please contact this office.     Autoexamen de [employer] Breast Self-Awareness El autoexamen de mamas es para Solicitor la apariencia y la sensibilidad de las [place]. Es importante autoexaminarse las [place]. Permite detectar un problema en las mamas a tiempo mientras todava es pequeo y puede tratarse. Todas las mujeres deben autoexaminarse las [place], incluso aquellas que se sometieron a implantes mamarios. Informe al mdico si advierte un cambio en las mamas. Lo que necesita:  Un espejo.  Una habitacin bien iluminada. Cmo realizar el autoexamen de mamas El autoexamen de [place] es una forma de aprender qu es normal para sus mamas y revisar si hay cambios. Para hacer un autoexamen de las mamas: Busque cambios  1. Qutese toda la ropa por encima de la cintura. 2. Prese frente a un espejo en una habitacin con buena iluminacin. 3. Apoye las manos en las caderas. 4. Empuje hacia abajo con las manos. 5. Mrese las mamas y los pezones en el espejo para ver si una mama o un pezn se ve diferente del otro. Durante el examen, intente determinar si: ? La forma de una mama es diferente. ? El tamao de [place] mama es [place]. ? Hay arrugas, depresiones y protuberancias en Deborha Payment y no en la otra. 6. Observe cada mama para buscar cambios en la piel, por ejemplo: ? Enrojecimiento. ? Zonas escamosas. 7. Observe si hay cambios en los pezones, por ejemplo: ? Lquido alrededor [employer]. ? Sangrado. ? Hoyuelos. ? Enrojecimiento. ? Un cambio en el lugar de los pezones. [place] si hay cambios  1. Acustese en el piso boca arriba. 2. Plpese cada  mama. Para hacerlo, siga estos pasos: ? Elija una mama para palpar. ? Coloque el brazo ms cercano a esa mama por encima de la cabeza. ? Use el otro brazo para palpar la zona del pezn de la mama. Plpese la zona con las yemas de los tres dedos del medio y haga crculos con los dedos. Con el primer crculo, presione suavemente. Con el segundo, ms fuerte. Con el tercero, an ms fuerte. ? Siga haciendo crculos con los dedos con las diferentes presiones a medida que desciende por la mama. Detngase cuando sienta las costillas. ? Desplace los dedos un poco hacia el centro del cuerpo. ? Empiece a hacer crculos con los dedos nuevamente y esta vez haga movimientos ascendentes hasta llegar a la clavcula. ? Siga haciendo crculos [place] y Milinda Antis llegar a la axila. Recuerde hacerlos con las tres presiones. ? Plpese la otra mama de la misma forma. 3. Sintese o prese en la ducha o la baera. 4. Con agua jabonosa en la piel, plpese cada mama del mismo modo que lo hizo en el paso2 mientras estaba acostada en el piso. Anote sus hallazgos Anotar lo que encuentra puede ayudarla a recordar qu contarle al mdico. [place] los siguientes datos:  Qu es normal para cada mama.  Cualquier cambio que encuentre en cada mama, por ejemplo: ? La clase de cambios que encuentra. ? Si tiene dolor. ? Si hay bultos, su tamao y [place].  Cundo tuvo su ltimo perodo menstrual. Consejos generales  [employer].  Si est amamantando, el mejor momento para el examen de las mamas es despus de darle de Psychologist, clinical al beb o despus de usar un sacaleches.  Si tiene perodos menstruales, el mejor momento para  hacerlo es de 5 a 7das despus de la finalizado el perodo menstrual.  Con el tiempo, se sentir cmoda con el autoexamen y Medical laboratory scientific officer a saber si hay cambios en sus mamas. Comunquese con un mdico si:  Observa un cambio en la forma o el tamao de las mamas o los  pezones.  Observa un cambio en la piel de las mamas o los pezones, como la piel enrojecida o escamosa.  Tiene secrecin de lquido proveniente de los pezones que no es normal.  [place] un ndulo o una zona engrosada que no tena antes.  Tiene dolor en las [place].  Tiene alguna inquietud [employer] de la [place]. Resumen  El autoexamen de mamas incluye buscar cambios en las [place], y tambin palpar para Hydrographic surveyor cualquier cambio en las [place].  El autoexamen de mamas debe hacerse frente a un espejo en una habitacin bien iluminada.  Debe revisarse las [employer]. Si tiene perodos menstruales, el mejor momento para hacerlo es de 5 a 7das despus de la finalizado el perodo menstrual.  Informe al mdico si ve cambios en las mamas, como cambios en el tamao, cambios en la piel, Social research officer, government o sensibilidad, o un lquido inusual que sale de los pezones. Esta informacin no tiene Marine scientist el consejo del mdico. Asegrese de hacerle al mdico cualquier pregunta que tenga. Document Released: 02/02/2010 Document Revised: 09/30/2017 Document Reviewed: 09/30/2017 Elsevier Patient Education  2020 Elsevier Inc.      Agustina Caroli, MD [facility] [employer] [facility] [facility]

## 2018-10-15 NOTE — Patient Instructions (Addendum)
If you have lab work done today you will be contacted with your lab results within the next 2 weeks.  If you have not heard from [place] then please contact us. The fastest way to get your results is to register for My Chart.   IF you received an x-ray today, you will receive an invoice from [facility]. Please contact [facility] Radiology at [phone] with questions or concerns regarding your invoice.   IF you received labwork today, you will receive an invoice from [place]. Please contact [facility] at [phone] with questions or concerns regarding your invoice.   Our billing staff will not be able to assist you with questions regarding bills from these companies.  You will be contacted with the lab results as soon as they are available. The fastest way to get your results is to activate your My Chart account. Instructions are located on the last page of this paperwork. If you have not heard from [place] regarding the results in 2 weeks, please contact this office.     Autoexamen de [employer] Breast Self-Awareness El autoexamen de mamas es para Civil engineer, contracting la apariencia y la sensibilidad de las [place]. Es importante autoexaminarse las [place]. Permite detectar un problema en las mamas a tiempo mientras todava es pequeo y puede tratarse. Todas las mujeres deben autoexaminarse las [place], incluso aquellas que se sometieron a implantes mamarios. Informe al mdico si advierte un cambio en las mamas. Lo que necesita:  Un espejo.  Una habitacin bien iluminada. Cmo realizar el autoexamen de mamas El autoexamen de [place] es una forma de aprender qu es normal para sus mamas y revisar si hay cambios. Para hacer un autoexamen de las mamas: Busque cambios  1. Qutese toda la ropa por encima de la cintura. 2. Prese frente a un espejo en una habitacin con buena iluminacin. 3. Apoye las manos en las caderas. 4. Empuje hacia abajo con las manos. [place] y los pezones en el espejo  para ver si una mama o un pezn se ve diferente del otro. Durante el examen, intente determinar si: ? La forma de una mama es diferente. ? El tamao de [place] mama es [place]. ? Hay arrugas, depresiones y protuberancias en [place] y no en la otra. 6. Observe cada mama para buscar cambios en la piel, por ejemplo: ? Enrojecimiento. ? Zonas escamosas. 7. Observe si hay cambios en los pezones, por ejemplo: ? Lquido alrededor [employer]. ? [place]. ? Hoyuelos. ? Enrojecimiento. ? Un cambio en el lugar de los pezones. [place] si hay cambios  1. Acustese en el piso boca arriba. 2. Plpese cada mama. Para hacerlo, siga estos pasos: ? Elija una mama para palpar. ? Coloque el brazo ms cercano a esa mama por encima de la cabeza. ? Use el otro brazo para palpar la zona del pezn de la mama. Plpese la zona con las yemas de los tres dedos del medio y haga crculos con los dedos. Con el primer crculo, presione suavemente. Con el segundo, ms fuerte. Con el tercero, an ms fuerte. ? Siga haciendo crculos con los dedos con las diferentes presiones a medida que desciende por la mama. Detngase cuando sienta las costillas. ? Desplace los dedos un poco hacia el centro del cuerpo. ? Empiece a hacer crculos con los dedos nuevamente y esta vez haga movimientos ascendentes hasta llegar a la clavcula. ? Siga haciendo crculos [place] y [place] llegar a la axila. Recuerde hacerlos con  las tres presiones. ? Plpese la otra mama de la misma forma. 3. Sintese o prese en la ducha o la baera. 4. Con agua jabonosa en la piel, plpese cada mama del mismo modo que lo hizo en el paso2 mientras estaba acostada en el piso. Anote sus hallazgos Anotar lo que encuentra puede ayudarla a recordar qu contarle al mdico. [place] los siguientes datos:  Qu es normal para cada mama.  Cualquier cambio que encuentre en cada mama, por ejemplo: ? La clase de cambios que encuentra. ? Si tiene  dolor. ? Si hay bultos, su tamao y [place].  Cundo tuvo su ltimo perodo menstrual. Consejos generales  [employer].  Si est amamantando, el mejor momento para el examen de las mamas es despus de darle de Psychologist, clinical al beb o despus de usar un sacaleches.  Si tiene perodos menstruales, el mejor momento para hacerlo es de 5 a 7das despus de la finalizado el perodo menstrual.  Con el tiempo, se sentir cmoda con el autoexamen y Games developer a saber si hay cambios en sus mamas. Comunquese con un mdico si:  Observa un cambio en la forma o el tamao de las [address] o los pezones.  Observa un cambio en la piel de las mamas o los pezones, como la piel enrojecida o escamosa.  Tiene secrecin de lquido proveniente de los pezones que no es normal.  [place] un ndulo o una zona engrosada que no tena antes.  Tiene dolor en las [place].  Tiene alguna inquietud [employer] de la [place]. Resumen  El autoexamen de mamas incluye buscar cambios en las [place], y tambin palpar para Engineer, manufacturing cualquier cambio en las [place].  El autoexamen de mamas debe hacerse frente a un espejo en una habitacin bien iluminada.  Debe revisarse las [employer]. Si tiene perodos menstruales, el mejor momento para hacerlo es de 5 a 7das despus de la finalizado el perodo menstrual.  Informe al mdico si ve cambios en las mamas, como cambios en el tamao, cambios en la piel, Engineer, mining o sensibilidad, o un lquido inusual que sale de los pezones. Esta informacin no tiene Theme park manager el consejo del mdico. Asegrese de hacerle al mdico cualquier pregunta que tenga. Document Released: 02/02/2010 Document Revised: 09/30/2017 Document Reviewed: 09/30/2017 Elsevier Patient Education  2020 [employer].

## 2018-10-26 ENCOUNTER — Other Ambulatory Visit (HOSPITAL_COMMUNITY): Payer: Self-pay | Admitting: *Deleted

## 2018-10-26 DIAGNOSIS — N644 Mastodynia: Secondary | ICD-10-CM

## 2018-11-12 ENCOUNTER — Ambulatory Visit (HOSPITAL_COMMUNITY)
Admission: RE | Admit: 2018-11-12 | Discharge: 2018-11-12 | Disposition: A | Discharge status: Home / Self Care | Payer: Self-pay | Source: Ambulatory Visit | Attending: Obstetrics and Gynecology | Admitting: Obstetrics and Gynecology

## 2018-11-12 ENCOUNTER — Other Ambulatory Visit: Payer: Self-pay

## 2018-11-12 ENCOUNTER — Ambulatory Visit
Admission: RE | Admit: 2018-11-12 | Discharge: 2018-11-12 | Disposition: A | Discharge status: Home / Self Care | Payer: No Typology Code available for payment source | Source: Ambulatory Visit | Attending: Obstetrics and Gynecology | Admitting: Obstetrics and Gynecology

## 2018-11-12 ENCOUNTER — Encounter (HOSPITAL_COMMUNITY): Payer: Self-pay

## 2018-11-12 DIAGNOSIS — N644 Mastodynia: Secondary | ICD-10-CM

## 2018-11-12 DIAGNOSIS — Z1239 Encounter for other screening for malignant neoplasm of breast: Secondary | ICD-10-CM

## 2018-11-12 NOTE — Patient Instructions (Signed)
Explained breast self awareness with Jacqueline Graves. Patient did not need a Pap smear today due to last Pap smear was in 2019 per patient. Let her know [facility]CP will cover Pap smears every 3 years unless has a history of abnormal Pap smears. Referred patient to the [facility] for a diagnostic mammogram. Appointment scheduled for Thursday, November 12, 2018 at 1520. Patient aware of appointment and will be there. Jacqueline Graves verbalized understanding.  Jacqueline Graves, Arvil Chaco, RN 2:23 PM

## 2018-11-12 NOTE — Progress Notes (Signed)
Complaints of left inner breast pain x 2-3 weeks when touched. Patient rates the pain at a 3-4 out of 10.  Pap Smear: Pap smear not completed today. Last Pap smear was in 2019 at the [facility] and normal per patient. Per patient has no history of an abnormal Pap smear. Last Pap smear result is not in Epic. Previous Pap smear result from 03/02/2012 is in [place].  Physical exam: Breasts Left breast larger than right breast that per patient is normal for her. No skin abnormalities bilateral breasts. No nipple retraction bilateral breasts. No nipple discharge bilateral breasts. No lymphadenopathy. No lumps palpated bilateral breasts. Complaints of left inner breast tenderness on exam. Referred patient to the [facility] for a diagnostic mammogram. Appointment scheduled for Thursday, November 12, 2018 at 1520.        Pelvic/Bimanual No Pap smear completed today since last Pap smear was in 2019 per patient. Pap smear not indicated per [facility]CP guidelines.   Smoking History: Patient has never smoked.  Patient Navigation: Patient education provided. Access to services provided for patient through [facility] program. Spanish interpreter provided.   Breast and Cervical Cancer Risk Assessment: Patient has no family history of breast cancer, known genetic mutations, or radiation treatment to the chest before age 66. Patient has no history of cervical dysplasia, immunocompromised, or DES exposure in-utero. Breast cancer risk assessment completed. No breast cancer risk calculated due to patient is less than 56 years old.  Used Spanish interpreter [employer] from [place].

## 2019-06-07 ENCOUNTER — Ambulatory Visit: Payer: Self-pay

## 2019-07-21 ENCOUNTER — Other Ambulatory Visit: Payer: Self-pay

## 2019-07-21 ENCOUNTER — Ambulatory Visit (INDEPENDENT_AMBULATORY_CARE_PROVIDER_SITE_OTHER): Payer: Self-pay | Admitting: Emergency Medicine

## 2019-07-21 ENCOUNTER — Encounter: Payer: Self-pay | Admitting: Emergency Medicine

## 2019-07-21 VITALS — BP 118/82 | HR 71 | Temp 98.5°F | Ht 64.0 in | Wt 191.0 lb

## 2019-07-21 DIAGNOSIS — M545 Low back pain, unspecified: Secondary | ICD-10-CM

## 2019-07-21 DIAGNOSIS — N926 Irregular menstruation, unspecified: Secondary | ICD-10-CM

## 2019-07-21 DIAGNOSIS — N912 Amenorrhea, unspecified: Secondary | ICD-10-CM

## 2019-07-21 LAB — POCT URINALYSIS DIP (MANUAL ENTRY)
Bilirubin, UA: NEGATIVE
Glucose, UA: NEGATIVE mg/dL
Ketones, POC UA: NEGATIVE mg/dL
Nitrite, UA: NEGATIVE
Spec Grav, UA: 1.025 (ref 1.010–1.025)
Urobilinogen, UA: 0.2 E.U./dL
pH, UA: 6.5 (ref 5.0–8.0)

## 2019-07-21 LAB — POCT URINE PREGNANCY: Preg Test, Ur: NEGATIVE

## 2019-07-21 NOTE — Patient Instructions (Addendum)
If you have lab work done today you will be contacted with your lab results within the next 2 weeks.  If you have not heard from [place] then please contact us. The fastest way to get your results is to register for My Chart.   IF you received an x-ray today, you will receive an invoice from [facility]. Please contact [facility] Radiology at [phone] with questions or concerns regarding your invoice.   IF you received labwork today, you will receive an invoice from [facility]. Please contact [facility] at [phone] with questions or concerns regarding your invoice.   Our billing staff will not be able to assist you with questions regarding bills from these companies.  You will be contacted with the lab results as soon as they are available. The fastest way to get your results is to activate your My Chart account. Instructions are located on the last page of this paperwork. If you have not heard from [place] regarding the results in 2 weeks, please contact this office.      Amenorrea primaria Primary Amenorrhea  Se llama amenorrea primaria en los casos de mujeres que han alcanzado los 15 aos de edad sin haber tenido perodos [place]. Cules son las causas? Esta afeccin puede ser causada por lo siguiente:  Una anomala en un cromosoma que hace que los ovarios no funcionen correctamente (frecuente).  Desnutricin.  Baja concentracin de Banker (hipoglucemia).  Sndrome del ovario poliqustico.  Nacer sin vagina, tero u ovarios.  Obesidad extrema.  Fibrosis qustica.  Descenso de peso drstico.  Realizacin excesiva de actividad fsica que causa una prdida de la grasa corporal.  Tumor en la hipfisis.  Enfermedad de larga duracin.  Enfermedad de [place].  Enfermedad de la tiroides, como hipotiroidismo o hipertiroidismo.  Una parte del cerebro llamada hipotlamo no funciona con normalidad.  Insuficiencia ovrica prematura. Cules son los  signos o los sntomas? El principal sntoma de esta afeccin es no haber tenido ningn perodo menstrual antes de los 15aos. Otros sntomas pueden ser los siguientes:  Sports administrator.  [place]calor[place].  Acn adulto.  Vello facial o en el pecho.  Dolores de [place].  Visin defectuosa.  Exceso de estrs reciente.  Cambios de [place], la dieta o los patrones de Environmental consultant. Cmo se diagnostica? Esta afeccin se puede diagnosticar en funcin de lo siguiente:  Sus antecedentes mdicos.  Un examen fsico.  Estudios, como por ejemplo: ? Anlisis de [place]. ? Anlisis de [place]. Cmo se trata? El tratamiento de esta afeccin depende de la causa. Por ejemplo, en caso de ausencia de los rganos [place], se deber realizar una ciruga para solucionar el problema. Otras causas podran responder al tratamiento con medicamentos. Siga estas indicaciones en su casa:  Mantenga una dieta saludable.  Mantenga un peso saludable.  Haga ejercicios con regularidad, pero no excesivamente.  Tome los medicamentos de venta libre y los recetados solamente como se lo haya indicado el mdico.  Oceanographer a todas las visitas de control como se lo haya indicado el mdico. Esto es importante. Comunquese con un mdico si:  Su cuerpo no se desarrolla al mismo ritmo que el de sus compaeras.  Siente dolor plvico.  Aumenta de peso de forma inusual.  Le crece una cantidad inusual de vello. Resumen  Se llama amenorrea primaria en los casos de mujeres que han alcanzado los 15 aos de edad sin haber tenido perodos [place].  Esta afeccin podra tener muchas causas; entre ellas: anomala en  los ovarios, obesidad y descenso de peso extremo.  Comunquese con un mdico si su cuerpo no se desarrolla al mismo ritmo que el de sus compaeras. Esta informacin no tiene Theme park manager el consejo del mdico. Asegrese de hacerle al mdico cualquier pregunta que tenga. Document Revised:  09/11/2016 Document Reviewed: 09/11/2016 Elsevier Patient Education  2020 [employer].

## 2019-07-21 NOTE — Progress Notes (Signed)
Valyncia Hlee Fringer 34 y.o.   Chief Complaint  Patient presents with  . Menstrual Problem    missed period with neg home pregnacy test.   . Back Pain    2 weeks and discharge    HISTORY OF PRESENT ILLNESS: This is a 34 y.o. female complaining of missed menstrual period for 2 to 3 weeks.  Negative home pregnancy test. Occasional lumbar pain.  Increased vaginal whitish secretions.  Married and sexually active.  No dyspareunia. No chronic medical problems.  No new medications.  No new diet or change in usual daily activities.  HPI   Prior to Admission medications   Medication Sig Start Date End Date Taking? Authorizing Provider  acetaminophen (TYLENOL) 500 MG tablet Take 1 tablet (500 mg total) by mouth every 6 (six) hours as needed for pain. 12/02/11  Yes Loren Racer, MD  ibuprofen (ADVIL,MOTRIN) 600 MG tablet Take 1 tablet (600 mg total) by mouth every 6 (six) hours as needed for pain. 08/07/12  Yes Minta Balsam, MD    No Known Allergies  There are no problems to display for this patient.   Past Medical History:  Diagnosis Date  . Medical history non-contributory     History reviewed. No pertinent surgical history.  Social History   Socioeconomic History  . Marital status: Single    Spouse name: Not on file  . Number of children: Not on file  . Years of education: Not on file  . Highest education level: Not on file  Occupational History  . Not on file  Tobacco Use  . Smoking status: Never Smoker  . Smokeless tobacco: Never Used  Substance and Sexual Activity  . Alcohol use: Yes    Comment: occassionally  . Drug use: No  . Sexual activity: Yes    Birth control/protection: None  Other Topics Concern  . Not on file  Social History Narrative  . Not on file   Social Determinants of Health   Financial Resource Strain:   . Difficulty of Paying Living Expenses:   Food Insecurity:   . Worried About Programme researcher, broadcasting/film/video in the Last Year:   . Engineer, site in the Last Year:   Transportation Needs: No Transportation Needs  . Lack of Transportation (Medical): No  . Lack of Transportation (Non-Medical): No  Physical Activity:   . Days of Exercise per Week:   . Minutes of Exercise per Session:   Stress:   . Feeling of Stress :   Social Connections:   . Frequency of Communication with Friends and Family:   . Frequency of Social Gatherings with Friends and Family:   . Attends Religious Services:   . Active Member of Clubs or Organizations:   . Attends Banker Meetings:   Marland Kitchen Marital Status:   Intimate Partner Violence:   . Fear of Current or Ex-Partner:   . Emotionally Abused:   Marland Kitchen Physically Abused:   . Sexually Abused:     Family History  Problem Relation Age of Onset  . Hypertension Father   . Hearing loss Neg Hx      Review of Systems  Constitutional: Negative.  Negative for chills and fever.  HENT: Negative.  Negative for congestion and sore throat.   Respiratory: Negative.  Negative for cough and shortness of breath.   Cardiovascular: Negative.  Negative for chest pain and palpitations.  Gastrointestinal: Negative.  Negative for abdominal pain, blood in stool, diarrhea, nausea and vomiting.  Genitourinary: Negative.  Negative for dysuria and hematuria.  Skin: Negative.  Negative for rash.  Neurological: Negative.  Negative for dizziness and headaches.  Endo/Heme/Allergies: Negative.   All other systems reviewed and are negative.  Vitals:   07/21/19 1512  BP: 118/82  Pulse: 71  Temp: 98.5 F (36.9 C)  SpO2: 98%     Physical Exam Vitals reviewed.  Constitutional:      Appearance: Normal appearance.  HENT:     Head: Normocephalic.  Eyes:     Extraocular Movements: Extraocular movements intact.     Conjunctiva/sclera: Conjunctivae normal.     Pupils: Pupils are equal, round, and reactive to light.  Cardiovascular:     Rate and Rhythm: Normal rate and regular rhythm.     Pulses: Normal pulses.      Heart sounds: Normal heart sounds.  Pulmonary:     Effort: Pulmonary effort is normal.     Breath sounds: Normal breath sounds.  Abdominal:     Palpations: Abdomen is soft.     Tenderness: There is no abdominal tenderness.  Musculoskeletal:        General: Normal range of motion.     Cervical back: Normal range of motion and neck supple.  Skin:    General: Skin is warm and dry.     Capillary Refill: Capillary refill takes less than 2 seconds.  Neurological:     General: No focal deficit present.     Mental Status: She is alert and oriented to person, place, and time.  Psychiatric:        Mood and Affect: Mood normal.        Behavior: Behavior normal.    Results for orders placed or performed in visit on 07/21/19 (from the past 24 hour(s))  POCT urine pregnancy     Status: None   Collection Time: 07/21/19  3:34 PM  Result Value Ref Range   Preg Test, Ur Negative Negative  POCT urinalysis dipstick     Status: Abnormal   Collection Time: 07/21/19  3:35 PM  Result Value Ref Range   Color, UA yellow yellow   Clarity, UA cloudy (A) clear   Glucose, UA negative negative mg/dL   Bilirubin, UA negative negative   Ketones, POC UA negative negative mg/dL   Spec Grav, UA 5.277 8.242 - 1.025   Blood, UA moderate (A) negative   pH, UA 6.5 5.0 - 8.0   Protein Ur, POC trace (A) negative mg/dL   Urobilinogen, UA 0.2 0.2 or 1.0 E.U./dL   Nitrite, UA Negative Negative   Leukocytes, UA Trace (A) Negative     ASSESSMENT & PLAN: Cammie was seen today for menstrual problem and back pain.  Diagnoses and all orders for this visit:  Acute low back pain without sciatica, unspecified back pain laterality -     POCT urinalysis dipstick  Missed period -     POCT urine pregnancy -     Ambulatory referral to Obstetrics / Gynecology  Amenorrhea -     Comprehensive metabolic panel -     Lipid panel -     CBC with Differential/Platelet -     TSH    Patient Instructions        If you have lab work done today you will be contacted with your lab results within the next 2 weeks.  If you have not heard from [place] then please contact us. The fastest way to get your results is to register for My  Chart.   IF you received an x-ray today, you will receive an invoice from [facility]iology. Please contact [facility] Radiology at [phone] with questions or concerns regarding your invoice.   IF you received labwork today, you will receive an invoice from [facility]. Please contact [facility] at [phone] with questions or concerns regarding your invoice.   Our billing staff will not be able to assist you with questions regarding bills from these companies.  You will be contacted with the lab results as soon as they are available. The fastest way to get your results is to activate your My Chart account. Instructions are located on the last page of this paperwork. If you have not heard from [place] regarding the results in 2 weeks, please contact this office.      Amenorrea primaria Primary Amenorrhea  Se llama amenorrea primaria en los casos de mujeres que han alcanzado los 15 aos de edad sin haber tenido perodos [place]. Cules son las causas? Esta afeccin puede ser causada por lo siguiente:  Una anomala en un cromosoma que hace que los ovarios no funcionen correctamente (frecuente).  Desnutricin.  Baja concentracin de Banker (hipoglucemia).  Sndrome del ovario poliqustico.  Nacer sin vagina, tero u ovarios.  Obesidad extrema.  Fibrosis qustica.  Descenso de peso drstico.  Realizacin excesiva de actividad fsica que causa una prdida de la grasa corporal.  Tumor en la hipfisis.  Enfermedad de larga duracin.  Enfermedad de [place].  Enfermedad de la tiroides, como hipotiroidismo o hipertiroidismo.  Una parte del cerebro llamada hipotlamo no funciona con normalidad.  Insuficiencia ovrica prematura. Cules son  los signos o los sntomas? El principal sntoma de esta afeccin es no haber tenido ningn perodo menstrual antes de los 15aos. Otros sntomas pueden ser los siguientes:  Sports administrator.  [place]caloramiento.  Acn adulto.  Vello facial o en el pecho.  Dolores de [place].  Visin defectuosa.  Exceso de estrs reciente.  Cambios de [place], la dieta o los patrones de Environmental consultant. Cmo se diagnostica? Esta afeccin se puede diagnosticar en funcin de lo siguiente:  Sus antecedentes mdicos.  Un examen fsico.  Estudios, como por ejemplo: ? Anlisis de [place]. ? Anlisis de [place]. Cmo se trata? El tratamiento de esta afeccin depende de la causa. Por ejemplo, en caso de ausencia de los rganos [place], se deber realizar una ciruga para solucionar el problema. Otras causas podran responder al tratamiento con medicamentos. Siga estas indicaciones en su casa:  Mantenga una dieta saludable.  Mantenga un peso saludable.  Haga ejercicios con regularidad, pero no excesivamente.  Tome los medicamentos de venta libre y los recetados solamente como se lo haya indicado el mdico.  Oceanographer a todas las visitas de control como se lo haya indicado el mdico. Esto es importante. Comunquese con un mdico si:  Su cuerpo no se desarrolla al mismo ritmo que el de sus compaeras.  Siente dolor plvico.  Aumenta de peso de forma inusual.  Le crece una cantidad inusual de vello. Resumen  Se llama amenorrea primaria en los casos de mujeres que han alcanzado los 15 aos de edad sin haber tenido perodos [place].  Esta afeccin podra tener muchas causas; entre ellas: anomala en los ovarios, obesidad y descenso de peso extremo.  Comunquese con un mdico si su cuerpo no se desarrolla al mismo ritmo que el de sus compaeras. Esta informacin no tiene Theme park manager el consejo del mdico. Asegrese de hacerle al mdico cualquier pregunta que tenga.  Document Revised:  09/11/2016 Document Reviewed: 09/11/2016 Elsevier Patient Education  2020 Elsevier Inc.      Edwina BarthMiguel Freddye Cardamone, MD [facility]

## 2019-07-22 LAB — COMPREHENSIVE METABOLIC PANEL
ALT: 9 IU/L (ref 0–32)
AST: 13 IU/L (ref 0–40)
Albumin/Globulin Ratio: 1.6 (ref 1.2–2.2)
Albumin: 4.4 g/dL (ref 3.8–4.8)
Alkaline Phosphatase: 70 IU/L (ref 48–121)
BUN/Creatinine Ratio: 13 (ref 9–23)
BUN: 9 mg/dL (ref 6–20)
Bilirubin Total: 0.2 mg/dL (ref 0.0–1.2)
CO2: 25 mmol/L (ref 20–29)
Calcium: 9.5 mg/dL (ref 8.7–10.2)
Chloride: 101 mmol/L (ref 96–106)
Creatinine, Ser: 0.68 mg/dL (ref 0.57–1.00)
GFR calc Af Amer: 133 mL/min/{1.73_m2} (ref >59)
GFR calc non Af Amer: 115 mL/min/{1.73_m2} (ref >59)
Globulin, Total: 2.8 g/dL (ref 1.5–4.5)
Glucose: 86 mg/dL (ref 65–99)
Potassium: 4.7 mmol/L (ref 3.5–5.2)
Sodium: 139 mmol/L (ref 134–144)
Total Protein: 7.2 g/dL (ref 6.0–8.5)

## 2019-07-22 LAB — CBC WITH DIFFERENTIAL/PLATELET
Basophils Absolute: 0.1 10*3/uL (ref 0.0–0.2)
Basos: 1 %
EOS (ABSOLUTE): 0.1 10*3/uL (ref 0.0–0.4)
Eos: 1 %
Hematocrit: 42.4 % (ref 34.0–46.6)
Hemoglobin: 13.5 g/dL (ref 11.1–15.9)
Immature Grans (Abs): 0 10*3/uL (ref 0.0–0.1)
Immature Granulocytes: 0 %
Lymphocytes Absolute: 2.1 10*3/uL (ref 0.7–3.1)
Lymphs: 26 %
MCH: 28.6 pg (ref 26.6–33.0)
MCHC: 31.8 g/dL (ref 31.5–35.7)
MCV: 90 fL (ref 79–97)
Monocytes Absolute: 0.7 10*3/uL (ref 0.1–0.9)
Monocytes: 8 %
Neutrophils Absolute: 5.2 10*3/uL (ref 1.4–7.0)
Neutrophils: 64 %
Platelets: 405 10*3/uL (ref 150–450)
RBC: 4.72 x10E6/uL (ref 3.77–5.28)
RDW: 13 % (ref 11.7–15.4)
WBC: 8.1 10*3/uL (ref 3.4–10.8)

## 2019-07-22 LAB — LIPID PANEL
Chol/HDL Ratio: 4.5 ratio — ABNORMAL HIGH (ref 0.0–4.4)
Cholesterol, Total: 179 mg/dL (ref 100–199)
HDL: 40 mg/dL (ref >39)
LDL Chol Calc (NIH): 100 mg/dL — ABNORMAL HIGH (ref 0–99)
Triglycerides: 229 mg/dL — ABNORMAL HIGH (ref 0–149)
VLDL Cholesterol Cal: 39 mg/dL (ref 5–40)

## 2019-07-22 LAB — TSH: TSH: 1.19 u[IU]/mL (ref 0.450–4.500)

## 2019-07-28 ENCOUNTER — Encounter: Payer: Self-pay | Admitting: Radiology

## 2019-08-27 ENCOUNTER — Encounter (HOSPITAL_COMMUNITY): Payer: Self-pay

## 2019-08-27 ENCOUNTER — Ambulatory Visit (HOSPITAL_COMMUNITY)
Admission: EM | Admit: 2019-08-27 | Discharge: 2019-08-27 | Disposition: A | Discharge status: Home / Self Care | Payer: HRSA Program | Attending: Urgent Care | Admitting: Urgent Care

## 2019-08-27 ENCOUNTER — Other Ambulatory Visit: Payer: Self-pay

## 2019-08-27 DIAGNOSIS — R05 Cough: Secondary | ICD-10-CM | POA: Insufficient documentation

## 2019-08-27 DIAGNOSIS — R52 Pain, unspecified: Secondary | ICD-10-CM

## 2019-08-27 DIAGNOSIS — R059 Cough, unspecified: Secondary | ICD-10-CM

## 2019-08-27 DIAGNOSIS — R439 Unspecified disturbances of smell and taste: Secondary | ICD-10-CM | POA: Diagnosis not present

## 2019-08-27 DIAGNOSIS — R519 Headache, unspecified: Secondary | ICD-10-CM | POA: Diagnosis not present

## 2019-08-27 DIAGNOSIS — B349 Viral infection, unspecified: Secondary | ICD-10-CM

## 2019-08-27 DIAGNOSIS — R6883 Chills (without fever): Secondary | ICD-10-CM

## 2019-08-27 DIAGNOSIS — R0981 Nasal congestion: Secondary | ICD-10-CM

## 2019-08-27 DIAGNOSIS — U071 COVID-19: Secondary | ICD-10-CM | POA: Insufficient documentation

## 2019-08-27 MED ORDER — PROMETHAZINE-DM 6.25-15 MG/5ML PO SYRP
5.0000 mL | ORAL_SOLUTION | Freq: Every evening | ORAL | 0 refills | Status: AC | PRN
Start: 2019-08-27 — End: ?

## 2019-08-27 MED ORDER — CETIRIZINE HCL 10 MG PO TABS: 10.0000 mg | ORAL_TABLET | Freq: Every day | ORAL | 0 refills | Status: AC

## 2019-08-27 MED ORDER — BENZONATATE 100 MG PO CAPS: 100.0000 mg | ORAL_CAPSULE | Freq: Three times a day (TID) | ORAL | 0 refills | Status: DC | PRN

## 2019-08-27 MED ORDER — PSEUDOEPHEDRINE HCL 60 MG PO TABS: 60.0000 mg | ORAL_TABLET | Freq: Three times a day (TID) | ORAL | 0 refills | Status: AC | PRN

## 2019-08-27 NOTE — Discharge Instructions (Signed)

## 2019-08-27 NOTE — ED Triage Notes (Signed)
Pt presents for COVID testing. States having cough, headaches, body aches, fever, chills, loss of taste, dizziness x 1 week. 'my head start spinning" when moving the head to the side.

## 2019-08-27 NOTE — ED Provider Notes (Signed)
[facility]   MRN: [id number] DOB: May 31, 1985  Subjective:   Jacqueline Graves is a 34 y.o. female presenting for 1 week history of persistent body aches, coughing, headaches, subjective fever, loss of sense of taste, dizziness, chills.  Patient states that she has had some intermittent vertigo.  Denies chest pain, shortness of breath.  Has not had vaccination for COVID-19, has not had COVID-19.  Her husband is also very sick with similar symptoms.  No current facility-administered medications for this encounter.  Current Outpatient Medications:  .  acetaminophen (TYLENOL) 500 MG tablet, Take 1 tablet (500 mg total) by mouth every 6 (six) hours as needed for pain., Disp: 30 tablet, Rfl: 0 .  ibuprofen (ADVIL,MOTRIN) 600 MG tablet, Take 1 tablet (600 mg total) by mouth every 6 (six) hours as needed for pain., Disp: 30 tablet, Rfl: 0   No Known Allergies  Past Medical History:  Diagnosis Date  . Medical history non-contributory      No past surgical history on file.  Family History  Problem Relation Age of Onset  . Hypertension Father   . Hearing loss Neg Hx     Social History   Tobacco Use  . Smoking status: Never Smoker  . Smokeless tobacco: Never Used  Substance Use Topics  . Alcohol use: Yes    Comment: occassionally  . Drug use: No    ROS   Objective:   Vitals: BP 123/78 (BP Location: Right Arm)   Pulse 88   Temp 98.8 F (37.1 C) (Oral)   Resp 18   LMP 08/02/2019 (Exact Date)   SpO2 99%   Physical Exam Constitutional:      General: She is not in acute distress.    Appearance: Normal appearance. She is well-developed. She is not ill-appearing, toxic-appearing or diaphoretic.  HENT:     Head: Normocephalic and atraumatic.     Right Ear: Tympanic membrane, ear canal and external ear normal. No drainage or tenderness. No middle ear effusion. There is no impacted cerumen. Tympanic membrane is not erythematous.     Left Ear: Tympanic membrane,  ear canal and external ear normal. No drainage or tenderness.  No middle ear effusion. There is no impacted cerumen. Tympanic membrane is not erythematous.     Nose: Nose normal. No congestion or rhinorrhea.     Mouth/Throat:     Mouth: Mucous membranes are moist. No oral lesions.     Pharynx: Oropharynx is clear. No pharyngeal swelling, oropharyngeal exudate, posterior oropharyngeal erythema or uvula swelling.     Tonsils: No tonsillar exudate or tonsillar abscesses.  Eyes:     General: No scleral icterus.       Right eye: No discharge.        Left eye: No discharge.     Extraocular Movements: Extraocular movements intact.     Right eye: Normal extraocular motion.     Left eye: Normal extraocular motion.     Conjunctiva/sclera: Conjunctivae normal.     Pupils: Pupils are equal, round, and reactive to light.  Cardiovascular:     Rate and Rhythm: Normal rate and regular rhythm.     Pulses: Normal pulses.     Heart sounds: Normal heart sounds. No murmur heard.  No friction rub. No gallop.   Pulmonary:     Effort: Pulmonary effort is normal. No respiratory distress.     Breath sounds: Normal breath sounds. No stridor. No wheezing, rhonchi or rales.  Musculoskeletal:  Cervical back: Normal range of motion and neck supple.  Lymphadenopathy:     Cervical: No cervical adenopathy.  Skin:    General: Skin is warm and dry.     Findings: No rash.  Neurological:     General: No focal deficit present.     Mental Status: She is alert and oriented to person, place, and time.  Psychiatric:        Mood and Affect: Mood normal.        Behavior: Behavior normal.        Thought Content: Thought content normal.        Judgment: Judgment normal.      Assessment and Plan :   PDMP not reviewed this encounter.  1. Viral syndrome   2. Cough   3. Nasal congestion   4. Generalized headaches   5. Chills   6. Body aches     Will manage for viral illness such as viral URI, viral syndrome,  viral rhinitis, high suspicion for COVID-19. Counseled patient on nature of COVID-19 including modes of transmission, diagnostic testing, management and supportive care.  Offered scripts for symptomatic relief. COVID 19 testing is pending. Counseled patient on potential for adverse effects with medications prescribed/recommended today, ER and return-to-clinic precautions discussed, patient verbalized understanding.     [name], [place] 08/27/19 [phone]

## 2019-08-28 LAB — SARS CORONAVIRUS 2 (TAT 6-24 HRS): SARS Coronavirus 2: POSITIVE — AB

## 2019-09-01 ENCOUNTER — Telehealth (INDEPENDENT_AMBULATORY_CARE_PROVIDER_SITE_OTHER): Payer: Self-pay | Admitting: Emergency Medicine

## 2019-09-01 ENCOUNTER — Encounter: Payer: Self-pay | Admitting: Emergency Medicine

## 2019-09-01 ENCOUNTER — Other Ambulatory Visit: Payer: Self-pay

## 2019-09-01 ENCOUNTER — Emergency Department (HOSPITAL_COMMUNITY)
Admission: EM | Admit: 2019-09-01 | Discharge: 2019-09-01 | Discharge status: Absent without leave | Payer: No Typology Code available for payment source

## 2019-09-01 VITALS — Ht 60.0 in | Wt 182.0 lb

## 2019-09-01 DIAGNOSIS — R110 Nausea: Secondary | ICD-10-CM

## 2019-09-01 DIAGNOSIS — M549 Dorsalgia, unspecified: Secondary | ICD-10-CM

## 2019-09-01 DIAGNOSIS — U071 COVID-19: Secondary | ICD-10-CM

## 2019-09-01 DIAGNOSIS — M791 Myalgia, unspecified site: Secondary | ICD-10-CM

## 2019-09-01 MED ORDER — HYDROCODONE-ACETAMINOPHEN 5-325 MG PO TABS: 1.0000 | ORAL_TABLET | Freq: Four times a day (QID) | ORAL | 0 refills | Status: AC | PRN

## 2019-09-01 MED ORDER — ONDANSETRON 4 MG PO TBDP: 4.0000 mg | ORAL_TABLET | Freq: Three times a day (TID) | ORAL | 0 refills | Status: AC | PRN

## 2019-09-01 NOTE — Progress Notes (Addendum)
Telemedicine Encounter- SOAP NOTE Established Patient MyChart video conference This video telephone encounter was conducted with the patient's (or proxy's) verbal consent via video audio telecommunications: yes/no: Yes Patient was instructed to have this encounter in a suitably private space; and to only have persons present to whom they give permission to participate. In addition, patient identity was confirmed by use of name plus two identifiers (DOB and address).  I discussed the limitations, risks, security and privacy concerns of performing an evaluation and management service by telephone and the availability of in person appointments. I also discussed with the patient that there may be a patient responsible charge related to this service. The patient expressed understanding and agreed to proceed.  I spent a total of TIME; 0 MIN TO 60 MIN: 20 minutes talking with the patient or their proxy.  Chief Complaint  Patient presents with  . Nasusa and vomitting    going on for the last 2 week  . postive for covid    got tested at [facility]   . Cough    going on the couple last weeks  . Chest Pain    Subjective   Jacqueline Graves is a 34 y.o. female established patient. Telephone visit today complaining of flulike symptoms that started 2 weeks ago.  Tested positive for Covid infection.  Denies fever or chills.  Denies difficulty breathing.  Mostly complaining of nausea, dizziness, and back pain.  Diminished oral intake.  Went to the ED today but it was too busy and went back home without being seen.  HPI   There are no problems to display for this patient.   Past Medical History:  Diagnosis Date  . Medical history non-contributory     Current Outpatient Medications  Medication Sig Dispense Refill  . ibuprofen (ADVIL,MOTRIN) 600 MG tablet Take 1 tablet (600 mg total) by mouth every 6 (six) hours as needed for pain. 30 tablet 0  . pseudoephedrine (SUDAFED) 60 MG tablet Take  1 tablet (60 mg total) by mouth every 8 (eight) hours as needed for congestion. 30 tablet 0  . cetirizine (ZYRTEC ALLERGY) 10 MG tablet Take 1 tablet (10 mg total) by mouth daily. (Patient not taking: Reported on 09/01/2019) 30 tablet 0  . promethazine-dextromethorphan (PROMETHAZINE-DM) 6.25-15 MG/5ML syrup Take 5 mLs by mouth at bedtime as needed for cough. (Patient not taking: Reported on 09/01/2019) 100 mL 0   No current facility-administered medications for this visit.    No Known Allergies  Social History   Socioeconomic History  . Marital status: Single    Spouse name: Not on file  . Number of children: Not on file  . Years of education: Not on file  . Highest education level: Not on file  Occupational History  . Not on file  Tobacco Use  . Smoking status: Never Smoker  . Smokeless tobacco: Never Used  Substance and Sexual Activity  . Alcohol use: Yes    Comment: occassionally  . Drug use: No  . Sexual activity: Yes    Birth control/protection: None  Other Topics Concern  . Not on file  Social History Narrative  . Not on file   Social Determinants of Health   Financial Resource Strain:   . Difficulty of Paying Living Expenses:   Food Insecurity:   . Worried About Programme researcher, broadcasting/film/video in the Last Year:   . Barista in the Last Year:   Transportation Needs: No Transportation Needs  .  Lack of Transportation (Medical): No  . Lack of Transportation (Non-Medical): No  Physical Activity:   . Days of Exercise per Week:   . Minutes of Exercise per Session:   Stress:   . Feeling of Stress :   Social Connections:   . Frequency of Communication with Friends and Family:   . Frequency of Social Gatherings with Friends and Family:   . Attends Religious Services:   . Active Member of Clubs or Organizations:   . Attends Banker Meetings:   Marland Kitchen Marital Status:   Intimate Partner Violence:   . Fear of Current or Ex-Partner:   . Emotionally Abused:   Marland Kitchen  Physically Abused:   . Sexually Abused:     Review of Systems  Constitutional: Negative.  Negative for chills and fever.  HENT: Negative.  Negative for congestion and sore throat.   Respiratory: Negative.  Negative for cough and shortness of breath.   Cardiovascular: Negative.  Negative for chest pain and palpitations.  Gastrointestinal: Positive for nausea.  Genitourinary: Negative.  Negative for dysuria and hematuria.  Musculoskeletal: Positive for back pain and myalgias.  Skin: Negative.  Negative for rash.  Neurological: Positive for dizziness.  All other systems reviewed and are negative.   Objective   Vitals as reported by the patient: Today's Vitals   09/01/19 1417  Weight: 182 lb (82.6 kg)  Height: 5' (1.524 m)    There are no diagnoses linked to this encounter. [name] was seen today for nasusa and vomitting, postive for covid, cough and chest pain.  Diagnoses and all orders for this visit:  COVID-19 virus infection  Nausea without vomiting -     ondansetron (ZOFRAN ODT) 4 MG disintegrating tablet; Take 1 tablet (4 mg total) by mouth every 8 (eight) hours as needed for nausea or vomiting.  Myalgia -     HYDROcodone-acetaminophen (NORCO) 5-325 MG tablet; Take 1 tablet by mouth every 6 (six) hours as needed for moderate pain or severe pain.  Acute bilateral back pain, unspecified back location -     HYDROcodone-acetaminophen (NORCO) 5-325 MG tablet; Take 1 tablet by mouth every 6 (six) hours as needed for moderate pain or severe pain.     I discussed the assessment and treatment plan with the patient. The patient was provided an opportunity to ask questions and all were answered. The patient agreed with the plan and demonstrated an understanding of the instructions.   The patient was advised to call back or seek an in-person evaluation if the symptoms worsen or if the condition fails to improve as anticipated.  I provided 20 minutes of non-face-to-face time  during this encounter.  Georgina Quint, MD  Primary Care at [facility]

## 2019-09-01 NOTE — Patient Instructions (Signed)
° ° ° °  If you have lab work done today you will be contacted with your lab results within the next 2 weeks.  If you have not heard from us then please contact us. The fastest way to get your results is to register for My Chart. ° ° °IF you received an x-ray today, you will receive an invoice from [facility]. Please contact [facility] at [phone] with questions or concerns regarding your invoice.  ° °IF you received labwork today, you will receive an invoice from [facility]. Please contact [facility] at [phone] with questions or concerns regarding your invoice.  ° °Our billing staff will not be able to assist you with questions regarding bills from these companies. ° °You will be contacted with the lab results as soon as they are available. The fastest way to get your results is to activate your My Chart account. Instructions are located on the last page of this paperwork. If you have not heard from us regarding the results in 2 weeks, please contact this office. °  ° ° ° °

## 2019-09-17 ENCOUNTER — Other Ambulatory Visit: Payer: Self-pay

## 2019-09-17 ENCOUNTER — Other Ambulatory Visit: Payer: Self-pay | Admitting: Critical Care Medicine

## 2019-09-17 DIAGNOSIS — Z20822 Contact with and (suspected) exposure to covid-19: Secondary | ICD-10-CM

## 2019-09-18 LAB — NOVEL CORONAVIRUS, NAA: SARS-CoV-2, NAA: NOT DETECTED

## 2019-09-24 ENCOUNTER — Encounter: Payer: No Typology Code available for payment source | Admitting: Medical

## 2020-01-31 ENCOUNTER — Encounter: Payer: Self-pay | Admitting: Emergency Medicine
